# Patient Record
Sex: Female | Born: 1990 | Race: White | Hispanic: No | Marital: Married | Smoking: Never smoker
Health system: Southern US, Community
[De-identification: ages and names within clinical notes are randomized; demographics above are authoritative.]

## PROBLEM LIST (undated history)

## (undated) DIAGNOSIS — O021 Missed abortion: Secondary | ICD-10-CM

## (undated) DIAGNOSIS — Z789 Other specified health status: Secondary | ICD-10-CM

## (undated) HISTORY — PX: NO PAST SURGERIES: SHX2092

---

## 2018-06-21 LAB — OB RESULTS CONSOLE HEPATITIS B SURFACE ANTIGEN: Hepatitis B Surface Ag: NEGATIVE

## 2018-06-21 LAB — OB RESULTS CONSOLE ABO/RH: RH Type: POSITIVE

## 2018-06-21 LAB — OB RESULTS CONSOLE HIV ANTIBODY (ROUTINE TESTING): HIV: NONREACTIVE

## 2018-06-21 LAB — OB RESULTS CONSOLE GC/CHLAMYDIA
Chlamydia: NEGATIVE
Gonorrhea: NEGATIVE

## 2018-06-21 LAB — OB RESULTS CONSOLE RPR: RPR: NONREACTIVE

## 2018-06-21 LAB — OB RESULTS CONSOLE ANTIBODY SCREEN: Antibody Screen: NEGATIVE

## 2018-06-21 LAB — OB RESULTS CONSOLE RUBELLA ANTIBODY, IGM: Rubella: IMMUNE

## 2018-10-17 NOTE — L&D Delivery Note (Signed)
Operative Delivery Note At 6:34 AM a viable female was delivered via VAVD Severiano Gilbert).  Presentation: vertex; Position: Left,, Occiput,, Anterior; Station: +3.  Patient had been effectively pushing x 2.5 hours.  Secondary to maternal exhaustion, I offered either episiotomy of Kiwi vacuum assist to expedite delivery.  Patient elected to proceed with Kiwi vacuum.   Verbal consent: obtained from patient.  Risks and benefits discussed in detail.  Risks include, but are not limited to the risks of anesthesia, bleeding, infection, damage to maternal tissues, fetal cephalhematoma, intracranial hemorrhage.  There is also the risk of inability to effect vaginal delivery of the head, or shoulder dystocia that cannot be resolved by established maneuvers, leading to the need for emergency cesarean section.  APGAR: 8, 9; weight pending.   Placenta status: S, I.   Cord: 3V with the following complications: none.  Cord pH: n/a  Anesthesia:  CLEA Instruments: Kiwi vacuum with 2 contractions; three push/pull efforts each.  No pop offs. Episiotomy:  n/a Lacerations:  n/a Suture Repair: n/a Est. Blood Loss (mL):  100  Mom to postpartum.  Baby to Couplet care / Skin to Skin.  Linda Hedges 01/24/2019, 6:50 AM

## 2018-12-27 LAB — OB RESULTS CONSOLE GBS: STREP GROUP B AG: POSITIVE

## 2019-01-14 ENCOUNTER — Telehealth (HOSPITAL_COMMUNITY): Payer: Self-pay | Admitting: *Deleted

## 2019-01-14 ENCOUNTER — Encounter (HOSPITAL_COMMUNITY): Payer: Self-pay | Admitting: *Deleted

## 2019-01-14 NOTE — Telephone Encounter (Signed)
Preadmission screen  

## 2019-01-21 ENCOUNTER — Inpatient Hospital Stay (HOSPITAL_COMMUNITY): Payer: BLUE CROSS/BLUE SHIELD

## 2019-01-21 ENCOUNTER — Encounter (HOSPITAL_COMMUNITY): Payer: Self-pay | Admitting: *Deleted

## 2019-01-22 ENCOUNTER — Other Ambulatory Visit (HOSPITAL_COMMUNITY): Payer: Self-pay | Admitting: *Deleted

## 2019-01-23 ENCOUNTER — Inpatient Hospital Stay (HOSPITAL_COMMUNITY): Payer: BLUE CROSS/BLUE SHIELD

## 2019-01-23 ENCOUNTER — Inpatient Hospital Stay (HOSPITAL_COMMUNITY)
Admission: AD | Admit: 2019-01-23 | Discharge: 2019-01-25 | DRG: 807 | Disposition: A | Discharge status: Home / Self Care | Payer: BLUE CROSS/BLUE SHIELD | Attending: Obstetrics & Gynecology | Admitting: Obstetrics & Gynecology

## 2019-01-23 ENCOUNTER — Other Ambulatory Visit: Payer: Self-pay

## 2019-01-23 ENCOUNTER — Encounter (HOSPITAL_COMMUNITY): Payer: Self-pay | Admitting: Obstetrics

## 2019-01-23 ENCOUNTER — Inpatient Hospital Stay (HOSPITAL_COMMUNITY): Payer: BLUE CROSS/BLUE SHIELD | Admitting: Anesthesiology

## 2019-01-23 DIAGNOSIS — Z3A39 39 weeks gestation of pregnancy: Secondary | ICD-10-CM

## 2019-01-23 DIAGNOSIS — O99824 Streptococcus B carrier state complicating childbirth: Principal | ICD-10-CM | POA: Diagnosis present

## 2019-01-23 DIAGNOSIS — Z3490 Encounter for supervision of normal pregnancy, unspecified, unspecified trimester: Secondary | ICD-10-CM

## 2019-01-23 DIAGNOSIS — O26893 Other specified pregnancy related conditions, third trimester: Secondary | ICD-10-CM | POA: Diagnosis present

## 2019-01-23 LAB — CBC
HCT: 36.3 % (ref 36.0–46.0)
Hemoglobin: 12.5 g/dL (ref 12.0–15.0)
MCH: 31.3 pg (ref 26.0–34.0)
MCHC: 34.4 g/dL (ref 30.0–36.0)
MCV: 90.8 fL (ref 80.0–100.0)
Platelets: 153 10*3/uL (ref 150–400)
RBC: 4 MIL/uL (ref 3.87–5.11)
RDW: 13 % (ref 11.5–15.5)
WBC: 9.6 10*3/uL (ref 4.0–10.5)
nRBC: 0 % (ref 0.0–0.2)

## 2019-01-23 LAB — TYPE AND SCREEN
ABO/RH(D): O POS
Antibody Screen: NEGATIVE

## 2019-01-23 LAB — RPR: RPR Ser Ql: NONREACTIVE

## 2019-01-23 MED ORDER — LIDOCAINE HCL (PF) 1 % IJ SOLN
30.0000 mL | INTRAMUSCULAR | Status: DC | PRN
  Filled 2019-01-23 (×2): qty 30

## 2019-01-23 MED ORDER — LIDOCAINE HCL (PF) 1 % IJ SOLN
INTRAMUSCULAR | Status: DC | PRN
  Administered 2019-01-23: 6 mL via EPIDURAL

## 2019-01-23 MED ORDER — LACTATED RINGERS IV SOLN: 500.0000 mL | Freq: Once | INTRAVENOUS | Status: DC

## 2019-01-23 MED ORDER — FLEET ENEMA 7-19 GM/118ML RE ENEM: 1.0000 | ENEMA | RECTAL | Status: DC | PRN

## 2019-01-23 MED ORDER — EPHEDRINE 5 MG/ML INJ: 10.0000 mg | INTRAVENOUS | Status: DC | PRN

## 2019-01-23 MED ORDER — OXYTOCIN 40 UNITS IN NORMAL SALINE INFUSION - SIMPLE MED
1.0000 m[IU]/min | INTRAVENOUS | Status: DC
  Administered 2019-01-23: 2 m[IU]/min via INTRAVENOUS
  Filled 2019-01-23: qty 1000

## 2019-01-23 MED ORDER — LACTATED RINGERS IV SOLN
INTRAVENOUS | Status: DC
  Administered 2019-01-23 (×2): via INTRAVENOUS

## 2019-01-23 MED ORDER — LACTATED RINGERS IV SOLN: 500.0000 mL | INTRAVENOUS | Status: DC | PRN

## 2019-01-23 MED ORDER — ONDANSETRON HCL 4 MG/2ML IJ SOLN
4.0000 mg | Freq: Four times a day (QID) | INTRAMUSCULAR | Status: DC | PRN
  Administered 2019-01-24: 4 mg via INTRAVENOUS
  Filled 2019-01-23: qty 2

## 2019-01-23 MED ORDER — SODIUM CHLORIDE (PF) 0.9 % IJ SOLN
INTRAMUSCULAR | Status: DC | PRN
  Administered 2019-01-23: 14 mL/h via EPIDURAL

## 2019-01-23 MED ORDER — FENTANYL CITRATE (PF) 100 MCG/2ML IJ SOLN: 50.0000 ug | INTRAMUSCULAR | Status: DC | PRN

## 2019-01-23 MED ORDER — FENTANYL-BUPIVACAINE-NACL 0.5-0.125-0.9 MG/250ML-% EP SOLN
12.0000 mL/h | EPIDURAL | Status: DC | PRN
  Filled 2019-01-23: qty 250

## 2019-01-23 MED ORDER — TERBUTALINE SULFATE 1 MG/ML IJ SOLN: 0.2500 mg | Freq: Once | INTRAMUSCULAR | Status: DC | PRN

## 2019-01-23 MED ORDER — SODIUM CHLORIDE 0.9 % IV SOLN
5.0000 10*6.[IU] | Freq: Once | INTRAVENOUS | Status: AC
  Administered 2019-01-23: 5 10*6.[IU] via INTRAVENOUS
  Filled 2019-01-23: qty 5

## 2019-01-23 MED ORDER — DIPHENHYDRAMINE HCL 50 MG/ML IJ SOLN: 12.5000 mg | INTRAMUSCULAR | Status: DC | PRN

## 2019-01-23 MED ORDER — PHENYLEPHRINE 40 MCG/ML (10ML) SYRINGE FOR IV PUSH (FOR BLOOD PRESSURE SUPPORT): 80.0000 ug | PREFILLED_SYRINGE | INTRAVENOUS | Status: DC | PRN

## 2019-01-23 MED ORDER — OXYTOCIN 40 UNITS IN NORMAL SALINE INFUSION - SIMPLE MED
2.5000 [IU]/h | INTRAVENOUS | Status: DC
  Filled 2019-01-23: qty 1000

## 2019-01-23 MED ORDER — OXYTOCIN BOLUS FROM INFUSION: 500.0000 mL | Freq: Once | INTRAVENOUS | Status: DC

## 2019-01-23 MED ORDER — OXYCODONE-ACETAMINOPHEN 5-325 MG PO TABS: 1.0000 | ORAL_TABLET | ORAL | Status: DC | PRN

## 2019-01-23 MED ORDER — ACETAMINOPHEN 325 MG PO TABS: 650.0000 mg | ORAL_TABLET | ORAL | Status: DC | PRN

## 2019-01-23 MED ORDER — PENICILLIN G 3 MILLION UNITS IVPB - SIMPLE MED
3.0000 10*6.[IU] | INTRAVENOUS | Status: DC
  Administered 2019-01-23 – 2019-01-24 (×4): 3 10*6.[IU] via INTRAVENOUS
  Filled 2019-01-23 (×5): qty 100

## 2019-01-23 MED ORDER — SOD CITRATE-CITRIC ACID 500-334 MG/5ML PO SOLN: 30.0000 mL | ORAL | Status: DC | PRN

## 2019-01-23 MED ORDER — OXYCODONE-ACETAMINOPHEN 5-325 MG PO TABS: 2.0000 | ORAL_TABLET | ORAL | Status: DC | PRN

## 2019-01-23 NOTE — Anesthesia Preprocedure Evaluation (Signed)
Anesthesia Evaluation  Patient identified by MRN, date of birth, ID band Patient awake    Reviewed: Allergy & Precautions, H&P , NPO status , Patient's Chart, lab work & pertinent test results, reviewed documented beta blocker date and time   Airway Mallampati: II  TM Distance: >3 FB Neck ROM: full    Dental no notable dental hx.    Pulmonary neg pulmonary ROS,    Pulmonary exam normal breath sounds clear to auscultation       Cardiovascular negative cardio ROS Normal cardiovascular exam Rhythm:regular Rate:Normal     Neuro/Psych negative neurological ROS  negative psych ROS   GI/Hepatic negative GI ROS, Neg liver ROS,   Endo/Other  negative endocrine ROS  Renal/GU negative Renal ROS  negative genitourinary   Musculoskeletal   Abdominal   Peds  Hematology negative hematology ROS (+)   Anesthesia Other Findings   Reproductive/Obstetrics (+) Pregnancy                             Anesthesia Physical Anesthesia Plan  ASA: II  Anesthesia Plan: Epidural   Post-op Pain Management:    Induction:   PONV Risk Score and Plan:   Airway Management Planned:   Additional Equipment:   Intra-op Plan:   Post-operative Plan:   Informed Consent: I have reviewed the patients History and Physical, chart, labs and discussed the procedure including the risks, benefits and alternatives for the proposed anesthesia with the patient or authorized representative who has indicated his/her understanding and acceptance.     Dental Advisory Given  Plan Discussed with: Anesthesiologist  Anesthesia Plan Comments: (Labs checked- platelets confirmed with RN in room. Fetal heart tracing, per RN, reported to be stable enough for sitting procedure. Discussed epidural, and patient consents to the procedure:  included risk of possible headache,backache, failed block, allergic reaction, and nerve injury. This  patient was asked if she had any questions or concerns before the procedure started.)        Anesthesia Quick Evaluation  

## 2019-01-23 NOTE — Anesthesia Procedure Notes (Signed)
Epidural Patient location during procedure: OB Start time: 01/23/2019 5:05 PM End time: 01/23/2019 5:20 PM  Staffing Anesthesiologist: Janeece Riggers, MD  Preanesthetic Checklist Completed: patient identified, site marked, surgical consent, pre-op evaluation, timeout performed, IV checked, risks and benefits discussed and monitors and equipment checked  Epidural Patient position: sitting Prep: site prepped and draped and DuraPrep Patient monitoring: continuous pulse ox and blood pressure Approach: midline Location: L4-L5 Injection technique: LOR air  Needle:  Needle type: Tuohy  Needle gauge: 17 G Needle length: 9 cm and 9 Needle insertion depth: 5 cm Catheter type: closed end flexible Catheter size: 19 Gauge Catheter at skin depth: 10 cm Test dose: negative  Assessment Events: blood not aspirated, injection not painful, no injection resistance, negative IV test and no paresthesia

## 2019-01-23 NOTE — Progress Notes (Signed)
Patient has received first dose of PCN Pitocin on 8 mU and feeling mild CTX CVX 2/75/-1, AROM, clear FHT Cat I  Linda Hedges, DO

## 2019-01-23 NOTE — H&P (Signed)
Nanea Jared is a 28 y.o. female presenting for elective IOL.  Antepartum course uncomplicated.  GBS positive; NKDA.  OB History    Gravida  1   Para      Term      Preterm      AB      Living        SAB      TAB      Ectopic      Multiple      Live Births             No past medical history on file. Past Surgical History:  Procedure Laterality Date  . NO PAST SURGERIES     Family History: family history includes Anxiety disorder in her maternal grandmother; Colon cancer in her paternal grandfather; Diabetes in her paternal grandmother; Melanoma in her mother. Social History:  reports that she has never smoked. She has never used smokeless tobacco. She reports previous alcohol use. She reports that she does not use drugs.     Maternal Diabetes: No Genetic Screening: Normal Maternal Ultrasounds/Referrals: Normal Fetal Ultrasounds or other Referrals:  None Maternal Substance Abuse:  No Significant Maternal Medications:  None Significant Maternal Lab Results:  Lab values include: Group B Strep positive Other Comments:  None  ROS Maternal Medical History:  Fetal activity: Perceived fetal activity is normal.   Last perceived fetal movement was within the past hour.    Prenatal complications: no prenatal complications Prenatal Complications - Diabetes: none.      Blood pressure 123/82, pulse (!) 108, temperature 98.3 F (36.8 C), temperature source Oral, resp. rate 18, height 5\' 4"  (1.626 m), weight 64.4 kg. Maternal Exam:  Uterine Assessment: Contraction strength is mild.  Contraction frequency is rare.   Abdomen: Patient reports no abdominal tenderness. Fundal height is c/w dates.   Estimated fetal weight is 7#4.       Fetal Exam Fetal Monitor Review: Baseline rate: 155.  Variability: moderate (6-25 bpm).   Pattern: accelerations present and no decelerations.    Fetal State Assessment: Category I - tracings are normal.     Physical Exam   Constitutional: She is oriented to person, place, and time. She appears well-developed and well-nourished.  GI: Soft. There is no rebound and no guarding.  Neurological: She is alert and oriented to person, place, and time.  Skin: Skin is warm and dry.  Psychiatric: She has a normal mood and affect. Her behavior is normal.    Prenatal labs: ABO, Rh: O/Positive/-- (09/05 0000) Antibody: Negative (09/05 0000) Rubella: Immune (09/05 0000) RPR: Nonreactive (09/05 0000)  HBsAg: Negative (09/05 0000)  HIV: Non-reactive (09/05 0000)  GBS: Positive (03/12 0000)   Assessment/Plan: 28yo G1 at 39 weeks for elective IOL -PCN for GBS ppx -Start pitocin -AROM when able -CLEA when desired -Anticipate NSVD   Linda Hedges 01/23/2019, 8:13 AM

## 2019-01-24 ENCOUNTER — Encounter (HOSPITAL_COMMUNITY): Payer: Self-pay | Admitting: *Deleted

## 2019-01-24 LAB — ABO/RH: ABO/RH(D): O POS

## 2019-01-24 MED ORDER — COCONUT OIL OIL: 1.0000 "application " | TOPICAL_OIL | Status: DC | PRN

## 2019-01-24 MED ORDER — ZOLPIDEM TARTRATE 5 MG PO TABS: 5.0000 mg | ORAL_TABLET | Freq: Every evening | ORAL | Status: DC | PRN

## 2019-01-24 MED ORDER — ONDANSETRON HCL 4 MG/2ML IJ SOLN: 4.0000 mg | INTRAMUSCULAR | Status: DC | PRN

## 2019-01-24 MED ORDER — DIPHENHYDRAMINE HCL 25 MG PO CAPS: 25.0000 mg | ORAL_CAPSULE | Freq: Four times a day (QID) | ORAL | Status: DC | PRN

## 2019-01-24 MED ORDER — WITCH HAZEL-GLYCERIN EX PADS: 1.0000 "application " | MEDICATED_PAD | CUTANEOUS | Status: DC | PRN

## 2019-01-24 MED ORDER — OXYCODONE-ACETAMINOPHEN 5-325 MG PO TABS: 2.0000 | ORAL_TABLET | ORAL | Status: DC | PRN

## 2019-01-24 MED ORDER — KETOROLAC TROMETHAMINE 30 MG/ML IJ SOLN
INTRAMUSCULAR | Status: AC
  Filled 2019-01-24: qty 1

## 2019-01-24 MED ORDER — ACETAMINOPHEN 325 MG PO TABS: 650.0000 mg | ORAL_TABLET | ORAL | Status: DC | PRN

## 2019-01-24 MED ORDER — PRENATAL MULTIVITAMIN CH
1.0000 | ORAL_TABLET | Freq: Every day | ORAL | Status: DC
  Administered 2019-01-24 – 2019-01-25 (×2): 1 via ORAL
  Filled 2019-01-24 (×2): qty 1

## 2019-01-24 MED ORDER — ONDANSETRON HCL 4 MG PO TABS: 4.0000 mg | ORAL_TABLET | ORAL | Status: DC | PRN

## 2019-01-24 MED ORDER — SENNOSIDES-DOCUSATE SODIUM 8.6-50 MG PO TABS
2.0000 | ORAL_TABLET | ORAL | Status: DC
  Administered 2019-01-24: 2 via ORAL
  Filled 2019-01-24: qty 2

## 2019-01-24 MED ORDER — DIBUCAINE (PERIANAL) 1 % EX OINT: 1.0000 "application " | TOPICAL_OINTMENT | CUTANEOUS | Status: DC | PRN

## 2019-01-24 MED ORDER — OXYCODONE-ACETAMINOPHEN 5-325 MG PO TABS: 1.0000 | ORAL_TABLET | ORAL | Status: DC | PRN

## 2019-01-24 MED ORDER — IBUPROFEN 600 MG PO TABS
600.0000 mg | ORAL_TABLET | Freq: Four times a day (QID) | ORAL | Status: DC
  Administered 2019-01-24 – 2019-01-25 (×5): 600 mg via ORAL
  Filled 2019-01-24 (×5): qty 1

## 2019-01-24 MED ORDER — BENZOCAINE-MENTHOL 20-0.5 % EX AERO
1.0000 "application " | INHALATION_SPRAY | CUTANEOUS | Status: DC | PRN
  Administered 2019-01-24: 1 via TOPICAL
  Filled 2019-01-24: qty 56

## 2019-01-24 MED ORDER — TETANUS-DIPHTH-ACELL PERTUSSIS 5-2.5-18.5 LF-MCG/0.5 IM SUSP: 0.5000 mL | Freq: Once | INTRAMUSCULAR | Status: DC

## 2019-01-24 MED ORDER — SIMETHICONE 80 MG PO CHEW: 80.0000 mg | CHEWABLE_TABLET | ORAL | Status: DC | PRN

## 2019-01-24 NOTE — Anesthesia Postprocedure Evaluation (Signed)
Anesthesia Post Note  Patient: Kathleen Sanchez  Procedure(s) Performed: AN AD [place]     Patient location during evaluation: Mother Baby Anesthesia Type: Epidural Level of consciousness: awake, awake and alert, oriented and patient cooperative Pain management: pain level controlled Vital Signs Assessment: post-procedure vital signs reviewed and stable Respiratory status: spontaneous breathing, nonlabored ventilation and respiratory function stable Cardiovascular status: stable Postop Assessment: no headache, no backache, no apparent nausea or vomiting and patient able to bend at knees Anesthetic complications: no Comments: Phone interview with patient.    Last Vitals:  Vitals:   01/24/19 1340 01/24/19 1740  BP: 112/69 116/79  Pulse: 65 70  Resp: 18 18  Temp: 36.8 C 36.7 C  SpO2:      Last Pain:  Vitals:   01/24/19 1740  TempSrc: Axillary  PainSc: 0-No pain   Pain Goal:                   Renly Guedes L

## 2019-01-24 NOTE — Lactation Note (Signed)
This note was copied from a baby's chart. Lactation Consultation Note  Patient Name: Kathleen Sanchez Date: 01/24/2019  Randel Books is 24 hours old and has been sleepy.  Baby is currently sleeping in crib and mom trying to nap.  Instructed to watch for feeding cues and call for feeding assist.   Maternal Data    Feeding    LATCH Score                   Interventions    Lactation Tools Discussed/Used     Consult Status      Ave Filter 01/24/2019, 11:50 AM

## 2019-01-24 NOTE — Lactation Note (Signed)
This note was copied from a baby's chart. Lactation Consultation Note  Patient Name: Kathleen Sanchez [id number] Date: 01/24/2019 Reason for consult: Initial assessment;Primapara;1st time breastfeeding;Term  -Mom called out for assistance with breastfeeding. -Initial visit with P55 mom, baby is now 26 hours old, del @ 39.4wks -Upon [facility] entrance, FOB changing diaper and mom preparing to feed. -Mom denies any leaking during her pregnancy but reports changes in breast size and shape. -Mom with medium size breasts and medium size erect nipples. -Demonstrated hand expression and breast massage prior to latching. I-nfant latched to right breast in cross-cradle hold after a few attempts, with mom using bobby pillow for support. Mom with tendency to pinch nipple forward and keep finger close to base of nipple on underside of breast. Demonstrated supporting breast further back and reviewed basic latch techniques. Infant's mouth open wide and lips flanged well. Audible swallows noted. Infant's body relaxed as feeding progressed. -Mom's nipple rounded after feeding x20 minutes and infant released on her own. Instructed mom to burp after feedings and offer second breast if desired.  -Demonstrated breaking the baby's seal on the breast if necessary to try for better latch, etc. -Discussed having nose and chin touching breast during feeding and striving for deep latch.  -Reviewed feeding 8-12 times in 24 hours and feeding with cues. Discussed feeding cues. -Reviewed expected output and milk production around day 3. -Discussed cross-cradle and football holds, alternating breasts, using pillows and/or rolled blankets for support. -Encouraged as much STS as possible, including STS with dad. -Reviewed methods to wake a sleepy baby for feedings and/or keep her awake during the feed. -Discussed using EBM and/or coconut oil if necessary for sore nipples. -Lactation brochure with phone number provided and notified of  inpatient/outpatient lactation services. Encouraged to call with any needs prior to [facility] visit tomorrow.  Maternal Data Has patient been taught Hand Expression?: Yes(reviewed) Does the patient have breastfeeding experience prior to this delivery?: No  Feeding Feeding Type: Breast Fed  LATCH Score Latch: Grasps breast easily, tongue down, lips flanged, rhythmical sucking.  Audible Swallowing: Spontaneous and intermittent  Type of Nipple: Everted at rest and after stimulation  Comfort (Breast/Nipple): Soft / non-tender  Hold (Positioning): Assistance needed to correctly position infant at breast and maintain latch.  LATCH Score: 9  Interventions Interventions: Breast feeding basics reviewed;Assisted with latch;Skin to skin;Breast massage;Hand express;Adjust position;Support pillows;Position options;Expressed milk;Coconut oil   Consult Status Consult Status: Follow-up Date: 01/25/19 Follow-up type: In-patient    Cranston Neighbor 01/24/2019, 4:43 PM

## 2019-01-25 LAB — CBC
HCT: 32.1 % — ABNORMAL LOW (ref 36.0–46.0)
Hemoglobin: 10.5 g/dL — ABNORMAL LOW (ref 12.0–15.0)
MCH: 29.8 pg (ref 26.0–34.0)
MCHC: 32.7 g/dL (ref 30.0–36.0)
MCV: 91.2 fL (ref 80.0–100.0)
Platelets: 116 10*3/uL — ABNORMAL LOW (ref 150–400)
RBC: 3.52 MIL/uL — ABNORMAL LOW (ref 3.87–5.11)
RDW: 13.1 % (ref 11.5–15.5)
WBC: 10.9 10*3/uL — ABNORMAL HIGH (ref 4.0–10.5)
nRBC: 0 % (ref 0.0–0.2)

## 2019-01-25 NOTE — Progress Notes (Signed)
Patient doing well. No complaints. BP 111/79 (BP Location: Right Arm)   Pulse 76   Temp 98.1 F (36.7 C) (Oral)   Resp 18   Ht 5\' 4"  (1.626 m)   Wt 64.4 kg   SpO2 99%   Breastfeeding Unknown   BMI 24.37 kg/m  Results for orders placed or performed during the hospital encounter of 01/23/19 (from the past 24 hour(s))  CBC     Status: Abnormal   Collection Time: 01/25/19  7:01 AM  Result Value Ref Range   WBC 10.9 (H) 4.0 - 10.5 K/uL   RBC 3.52 (L) 3.87 - 5.11 MIL/uL   Hemoglobin 10.5 (L) 12.0 - 15.0 g/dL   HCT 32.1 (L) 36.0 - 46.0 %   MCV 91.2 80.0 - 100.0 fL   MCH 29.8 26.0 - 34.0 pg   MCHC 32.7 30.0 - 36.0 g/dL   RDW 13.1 11.5 - 15.5 %   Platelets 116 (L) 150 - 400 K/uL   nRBC 0.0 0.0 - 0.2 %   Abdomen soft and non tender Lochia WHL  PPD # 1  Doing well Routine care

## 2019-01-25 NOTE — Discharge Summary (Signed)
Obstetric Discharge Summary Reason for Admission: induction of labor Prenatal Procedures: none Intrapartum Procedures: Vacuum Postpartum Procedures: none Complications-Operative and Postpartum: none Hemoglobin  Date Value Ref Range Status  01/25/2019 10.5 (L) 12.0 - 15.0 g/dL Final   HCT  Date Value Ref Range Status  01/25/2019 32.1 (L) 36.0 - 46.0 % Final    Physical Exam:  General: alert, cooperative and appears stated age 28: appropriate Uterine Fundus: firm Incision: healing well, no significant drainage, no dehiscence DVT Evaluation: No evidence of DVT seen on physical exam.  Discharge Diagnoses: Term Pregnancy-delivered  Discharge Information: Date: 01/25/2019 Activity: pelvic rest Diet: routine Medications: None Condition: improved Instructions: refer to practice specific booklet Discharge to: home   Newborn Data: Live born female  Birth Weight: 6 lb 8.1 oz (2950 g) APGAR: 7, 9  Newborn Delivery   Birth date/time:  01/24/2019 06:34:00 Delivery type:  Vaginal, Spontaneous     Home with mother.  Cyril Mourning 01/25/2019, 1:45 PM

## 2019-01-25 NOTE — Lactation Note (Signed)
This note was copied from a baby's chart. Lactation Consultation Note  Patient Name: Kathleen Sanchez [id number] Date: 01/25/2019 Reason for consult: Follow-up assessment;Primapara;1st time breastfeeding;Term  P1 mother whose infant is now 12 hours old.    Mother had recently finished breast feeding baby but she is awake and showing cues.  Offered to assist with latching and mother accepted.  Mother's breasts are soft and non tender and nipples are everted and intact.  Mother does have a bruised area on her left nipple from a prior poor latch.  She stated that the nipple hurts and she has been only feeding from the right side with the last couple attempts.  Suggested she hand express after feedings and rub EBM into nipple/areolas and follow with coconut oil.  Mother wanted to try latching initially without assistance.  Observed her trying to latch baby before she opened real wide and "forcing" nipple into mouth.  Baby latched but was not deep.  Mother noted pain.  I had her break the suction and assisted a second time with a deep latch after baby opened wide.  Baby had flanged lips and began sucking.  Demonstrated breast compressions and how to keep baby awake at the breast for effective sucking.  Also demonstrated how to do a gentle chin tug if mother still had pain at the breast after latching.  Stressed the importance of obtaining a good deep latch even if she has to attempt this a few times.    Encouraged to continue feeding 8-12 times/24 hours or sooner if baby shows feeding cues.  Reviewed cues.  Observed mother's hand expression technique and assisted for better hand placement.  Colostrum container provided and milk storage times reviewed.  Finger feeding demonstrated.    Baby fed for approximately 15 minutes and became sleepy.  Father demonstrated a good swaddle and placed baby in bassinet.  RN in to room for medication and updated.  Mother will call as needed for latch  assistance.  Mother will return to work in 59 weeks.  She has a DEBP for home use.  Father present and supportive.  Both parents are very receptive to learning.   Maternal Data Formula Feeding for Exclusion: No Has patient been taught Hand Expression?: Yes Does the patient have breastfeeding experience prior to this delivery?: No  Feeding Feeding Type: Breast Fed  LATCH Score Latch: Grasps breast easily, tongue down, lips flanged, rhythmical sucking.  Audible Swallowing: A few with stimulation  Type of Nipple: Everted at rest and after stimulation  Comfort (Breast/Nipple): Filling, red/small blisters or bruises, mild/mod discomfort  Hold (Positioning): Assistance needed to correctly position infant at breast and maintain latch.  LATCH Score: 7  Interventions Interventions: Breast feeding basics reviewed;Assisted with latch;Breast massage;Skin to skin;Breast compression;Expressed milk;Position options;Support pillows;Adjust position  Lactation Tools Discussed/Used WIC Program: No   Consult Status Consult Status: Follow-up Date: 01/26/19 Follow-up type: In-patient    Lovie Agresta R Harryette Shuart 01/25/2019, 11:59 AM

## 2019-03-05 DIAGNOSIS — R87619 Unspecified abnormal cytological findings in specimens from cervix uteri: Secondary | ICD-10-CM | POA: Insufficient documentation

## 2019-05-22 LAB — HM PAP SMEAR: HM Pap smear: NEGATIVE

## 2019-11-26 ENCOUNTER — Ambulatory Visit (INDEPENDENT_AMBULATORY_CARE_PROVIDER_SITE_OTHER): Payer: 59 | Admitting: Otolaryngology

## 2019-11-26 ENCOUNTER — Other Ambulatory Visit: Payer: Self-pay

## 2019-11-26 ENCOUNTER — Encounter (INDEPENDENT_AMBULATORY_CARE_PROVIDER_SITE_OTHER): Payer: Self-pay | Admitting: Otolaryngology

## 2019-11-26 VITALS — Temp 97.7°F

## 2019-11-26 DIAGNOSIS — K219 Gastro-esophageal reflux disease without esophagitis: Secondary | ICD-10-CM | POA: Diagnosis not present

## 2019-11-26 DIAGNOSIS — R1314 Dysphagia, pharyngoesophageal phase: Secondary | ICD-10-CM | POA: Diagnosis not present

## 2019-11-26 NOTE — Progress Notes (Signed)
HPI: Kathleen Sanchez is a 29 y.o. female who presents is referred by her PCP for evaluation of throat complaints.  About a week ago she was eating breakfast which consisted of cereal that felt like it got caught in her throat.  She swallowed some liquids and it eventually passed but she continues to have the symptoms of discomfort and something caught in her throat and points to the area of the cricoid cartilage and below.  She still has had slight sensation.  She has had no trouble eating or swallowing but has a chronic globus type symptoms.  She is having no hoarseness.  No pain when she swallows. She has had Covid test that have been negative although her husband was positive..  No past medical history on file. Past Surgical History:  Procedure Laterality Date  . NO PAST SURGERIES     Social History   Socioeconomic History  . Marital status: Married    Spouse name: Not on file  . Number of children: Not on file  . Years of education: Not on file  . Highest education level: Not on file  Occupational History  . Not on file  Tobacco Use  . Smoking status: Never Smoker  . Smokeless tobacco: Never Used  Substance and Sexual Activity  . Alcohol use: Not Currently  . Drug use: Never  . Sexual activity: Not on file  Other Topics Concern  . Not on file  Social History Narrative  . Not on file   Social Determinants of Health   Financial Resource Strain: Low Risk   . Difficulty of Paying Living Expenses: Not hard at all  Food Insecurity: No Food Insecurity  . Worried About Charity fundraiser in the Last Year: Never true  . Ran Out of Food in the Last Year: Never true  Transportation Needs: Unknown  . Lack of Transportation (Medical): No  . Lack of Transportation (Non-Medical): Not on file  Physical Activity:   . Days of Exercise per Week: Not on file  . Minutes of Exercise per Session: Not on file  Stress: No Stress Concern Present  . Feeling of Stress : Only a little  Social  Connections:   . Frequency of Communication with Friends and Family: Not on file  . Frequency of Social Gatherings with Friends and Family: Not on file  . Attends Religious Services: Not on file  . Active Member of Clubs or Organizations: Not on file  . Attends Archivist Meetings: Not on file  . Marital Status: Not on file   Family History  Problem Relation Age of Onset  . Melanoma Mother   . Anxiety disorder Maternal Grandmother   . Diabetes Paternal Grandmother   . Colon cancer Paternal Grandfather    No Known Allergies Prior to Admission medications   Medication Sig Start Date End Date Taking? Authorizing Provider  Prenatal Vit-Fe Fumarate-FA (PRENATAL MULTIVITAMIN) TABS tablet Take 1 tablet by mouth daily at 12 noon.    [provider]     Positive ROS: Otherwise negative  All other systems have been reviewed and were otherwise negative with the exception of those mentioned in the HPI and as above.  Physical Exam: Constitutional: Alert, well-appearing, no acute distress Ears: External ears without lesions or tenderness. Ear canals are clear bilaterally with intact, clear TMs.  Nasal: External nose without lesions. Septum midline. Clear nasal passages Oral: Lips and gums without lesions. Tongue and palate mucosa without lesions. Posterior oropharynx clear. Fiberoptic laryngoscopy  was performed through the left nostril.  The nasopharynx was clear.  Base of tongue vallecula epiglottis were normal.  Vocal cords were normal bilaterally.  Had minimal arytenoid edema no erythema.  The fiberoptic laryngoscope was passed through the upper esophageal sphincter without difficulty.  The upper cervical esophagus was clear with no evidence of any residual foreign body.  No lesions noted. Neck: No palpable adenopathy or masses trachea midline with no paratracheal masses or thyroid nodules noted. Respiratory: Breathing comfortably  Skin: No facial/neck lesions or rash  noted.  Laryngoscopy  Date/Time: 11/26/2019 2:44 PM Performed by: Rozetta Nunnery, MD Authorized by: Rozetta Nunnery, MD   Consent:    Consent obtained:  Verbal   Consent given by:  Patient   Risks discussed:  Pain Procedure details:    Indications: direct visualization of the upper aerodigestive tract     Medication:  Afrin   Instrument: flexible fiberoptic laryngoscope     Scope location: left nare   Mouth:    Vallecula: normal     Epiglottis: normal   Throat:    Pyriform sinus: normal     True vocal cords: normal   Comments:     Normal upper airway examination.  The fiberoptic laryngoscope was passed through the upper esophageal sphincter and the upper cervical esophagus was clear.    Assessment: Globus type symptoms possibly related to previous episode of dysphagia versus laryngeal pharyngeal reflux disease.  Plan: Discussed with patient concerning normal examination. I would expect symptoms to gradually resolve over the next week.  If symptoms do not resolve I prescribed omeprazole 40 mg daily before dinner for 4 weeks. If she continues to have persistent symptoms beyond 4 weeks of omeprazole she will call us back.   Radene Journey, MD   CC:

## 2020-03-20 ENCOUNTER — Other Ambulatory Visit: Payer: Self-pay | Admitting: Family Medicine

## 2020-03-20 DIAGNOSIS — M7989 Other specified soft tissue disorders: Secondary | ICD-10-CM

## 2020-03-26 ENCOUNTER — Ambulatory Visit
Admission: RE | Admit: 2020-03-26 | Discharge: 2020-03-26 | Disposition: A | Discharge status: Home / Self Care | Payer: 59 | Source: Ambulatory Visit | Attending: Family Medicine | Admitting: Family Medicine

## 2020-03-26 DIAGNOSIS — M7989 Other specified soft tissue disorders: Secondary | ICD-10-CM

## 2020-04-27 ENCOUNTER — Other Ambulatory Visit: Payer: Self-pay

## 2020-04-27 ENCOUNTER — Encounter: Payer: Self-pay | Admitting: Physician Assistant

## 2020-04-27 ENCOUNTER — Ambulatory Visit (INDEPENDENT_AMBULATORY_CARE_PROVIDER_SITE_OTHER): Payer: No Typology Code available for payment source | Admitting: Physician Assistant

## 2020-04-27 VITALS — BP 110/78 | HR 98 | Temp 98.1°F | Ht 64.0 in | Wt 119.6 lb

## 2020-04-27 DIAGNOSIS — R200 Anesthesia of skin: Secondary | ICD-10-CM | POA: Diagnosis not present

## 2020-04-27 DIAGNOSIS — R202 Paresthesia of skin: Secondary | ICD-10-CM

## 2020-04-27 DIAGNOSIS — R2241 Localized swelling, mass and lump, right lower limb: Secondary | ICD-10-CM | POA: Diagnosis not present

## 2020-04-27 NOTE — Patient Instructions (Signed)
It was great to see you!  A referral has been placed for you to see Dr. Evan Corey with [facility]. Someone from there office will be in touch soon regarding your appointment with him. His location: [facility] at [place] [address] on the 1st floor.   Phone number [phone], Fax [phone].  This location is across the street from the entrance to [facility] and in the same complex as the [facility] and [facility]   Take care,  Sharrod Achille PA-C  

## 2020-04-27 NOTE — Progress Notes (Signed)
Kathleen Sanchez is a 29 y.o. female is here to establish care.  I acted as a Education administrator for [employer], PA-C [employer], [place]  History of Present Illness:   Chief Complaint  Patient presents with  . New Patient (Initial Visit)    HPI  Lump in leg Pt c/o raised area of skin to R outer thigh. She noticed a lump after doing a leg workout about a month ago. She went to an urgent care regarding this issue and she was told to get an ultrasound, which was negative. The area is not painful, has not changed in size since she first noticed it.  Tingling to R outer toes She has also noticed tingling in outer 3 toes of the right foot whenever the anterior portion of her R ankle has pressure applied to it. Denies known trauma. Has not changed with time.   Health Maintenance Due  Topic Date Due  . Hepatitis C Screening  Never done  . TETANUS/TDAP  Never done    History reviewed. No pertinent past medical history.   Social History   Tobacco Use  . Smoking status: Never Smoker  . Smokeless tobacco: Never Used  Vaping Use  . Vaping Use: Never used  Substance Use Topics  . Alcohol use: Not Currently  . Drug use: Never    Past Surgical History:  Procedure Laterality Date  . NO PAST SURGERIES      Family History  Problem Relation Age of Onset  . Melanoma Mother   . Anxiety disorder Maternal Grandmother   . Diabetes Paternal Grandmother   . Cancer Paternal Grandfather        unknown  . Breast cancer Neg Hx     PMHx, SurgHx, SocialHx, FamHx, Medications, and Allergies were reviewed in the Visit Navigator and updated as appropriate.   Patient Active Problem List   Diagnosis Date Noted  . Abnormal cervical Papanicolaou smear 03/05/2019  . Pregnancy 01/23/2019    Social History   Tobacco Use  . Smoking status: Never Smoker  . Smokeless tobacco: Never Used  Vaping Use  . Vaping Use: Never used  Substance Use Topics  . Alcohol use: Not Currently  . Drug use: Never      Current Medications and Allergies:    Current Outpatient Medications:  .  Prenatal Vit-Fe Fumarate-FA (PRENATAL MULTIVITAMIN) TABS tablet, Take 1 tablet by mouth daily at 12 noon., Disp: , Rfl:   No Known Allergies  Review of Systems   ROS Negative unless otherwise specified per HPI.  Vitals:   Vitals:   04/27/20 0901  BP: 110/78  Pulse: 98  Temp: 98.1 F (36.7 C)  TempSrc: Temporal  SpO2: 99%  Weight: 119 lb 9.6 oz (54.3 kg)  Height: 5\' 4"  (1.626 m)     Body mass index is 20.53 kg/m.   Physical Exam:    Physical Exam Constitutional:      Appearance: She is well-developed.  HENT:     Head: Normocephalic and atraumatic.  Eyes:     Conjunctiva/sclera: Conjunctivae normal.  Pulmonary:     Effort: Pulmonary effort is normal.  Musculoskeletal:        General: Normal range of motion.     Cervical back: Normal range of motion and neck supple.     Comments: R thigh: Visible, raised mass to lateral R thigh without TTP  R foot: No visible abnormalities.   Skin:    General: Skin is warm and dry.  Neurological:  Mental Status: She is alert and oriented to person, place, and time.     Comments: When pressing anterior R ankle or with extension of R foot, patient reports tingling to R outer toes.  Psychiatric:        Behavior: Behavior normal.        Thought Content: Thought content normal.        Judgment: Judgment normal.      Assessment and Plan:    [name] was seen today for new patient (initial visit).  Diagnoses and all orders for this visit:  Lump of right thigh Suspect lipoma, but will refer to Sports Medicine for further evaluation and management.  Numbness and tingling of right lower extremity Possible intermittent neuritis of R ankle/lower extremity. Will refer to Sports Medicine for further evaluation and management.  . Reviewed expectations re: course of current medical issues. . Discussed self-management of symptoms. . Outlined  signs and symptoms indicating need for more acute intervention. . Patient verbalized understanding and all questions were answered. . See orders for this visit as documented in the electronic medical record. . Patient received an After Visit Summary.  CMA or LPN served as scribe during this visit. History, Physical, and Plan performed by medical provider. The above documentation has been reviewed and is accurate and complete.  Inda Coke, PA-C [place] [place] 04/27/2020  Follow-up: No follow-ups on file.

## 2020-04-27 NOTE — Assessment & Plan Note (Signed)
Hx of colposcopy

## 2020-05-05 ENCOUNTER — Encounter: Payer: Self-pay | Admitting: Physician Assistant

## 2020-06-09 ENCOUNTER — Encounter: Payer: Self-pay | Admitting: Family Medicine

## 2020-06-09 ENCOUNTER — Ambulatory Visit: Payer: No Typology Code available for payment source | Admitting: Family Medicine

## 2020-06-09 ENCOUNTER — Ambulatory Visit: Payer: Self-pay

## 2020-06-09 ENCOUNTER — Other Ambulatory Visit: Payer: Self-pay

## 2020-06-09 VITALS — BP 102/72 | HR 100 | Ht 64.0 in | Wt 119.6 lb

## 2020-06-09 DIAGNOSIS — D1723 Benign lipomatous neoplasm of skin and subcutaneous tissue of right leg: Secondary | ICD-10-CM | POA: Diagnosis not present

## 2020-06-09 DIAGNOSIS — R202 Paresthesia of skin: Secondary | ICD-10-CM

## 2020-06-09 DIAGNOSIS — M79651 Pain in right thigh: Secondary | ICD-10-CM | POA: Diagnosis not present

## 2020-06-09 NOTE — Patient Instructions (Addendum)
Thank you for coming in today.  I think the thigh bump is a lipoma.  OK to watch. If worsening or bothersome let me know. Will arrange MRI for further eval and possible excision planning.  OK to ignore if not bothersome.   The ankle and foot numb tingly is likely irritation of a small nerve in the foot.  Again watchful waiting. Consider taking B complex vitamins for a month. In general recommend also taking Vit D in supplement 1000-5000 units daily.   If worsening we can do more including prescribing a medicine for mostly bedtime and ordering more tests.   The name of the numb tingly is paresthesia.   Keep me updated and recheck as needed.

## 2020-06-09 NOTE — Progress Notes (Signed)
Subjective:    I'm seeing this patient as a consultation for:  Len Blalock, [place]. Note will be routed back to referring provider/PCP.  CC: R thigh pain and numbness/tingling in R toes  I, Molly Weber, LAT, ATC, am serving as scribe for Dr. Lynne Leader.  HPI: Pt is a 29 y/o female presenting w/ c/o R thigh pain and numbness/tingling in R toes 3-5.  R thigh: She reports noticing a "bump/lump" on her R thigh after doing a leg workout in May 2021.  She locates the "bump" to her R distal, lateral quad.  There is no pain associated w/ this.  She feels like it is unchanged since it appeared.  R toes 3-5: Pt notes numbness/tingling in her R toes 3-5.  Aggravating factors include pressure to her R anterior ankle.  Past medical history, Surgical history, Family history, Social history, Allergies, and medications have been entered into the medical record, reviewed.   Review of Systems: No new headache, visual changes, nausea, vomiting, diarrhea, constipation, dizziness, abdominal pain, skin rash, fevers, chills, night sweats, weight loss, swollen lymph nodes, body aches, joint swelling, muscle aches, chest pain, shortness of breath, mood changes, visual or auditory hallucinations.   Objective:    Vitals:   06/09/20 0831  BP: 102/72  Pulse: 100  SpO2: 98%   General: Well Developed, well nourished, and in no acute distress.  Neuro/Psych: Alert and oriented x3, extra-ocular muscles intact, able to move all 4 extremities, sensation grossly intact. Skin: Warm and dry, no rashes noted.  Respiratory: Not using accessory muscles, speaking in full sentences, trachea midline.  Cardiovascular: Pulses palpable, no extremity edema. Abdomen: Does not appear distended. MSK: Right thigh slight swelling at the lateral distal thigh.  Nontender normal hip and knee motion and strength.  Right ankle normal-appearing normal motion and strength. Pressure over anterior lateral ankle produces paresthesias dorsal  lateral 3 toes and dorsal lateral foot Pulses capillary fill and sensation are intact distally.  Lab and Radiology Results \ Diagnostic Limited MSK Ultrasound of: Right thigh and right ankle Right thigh: Normal-appearing muscular structures. Area of palpable small nodularity or swelling increased thickness subcutaneous fat with area of circular fat nodule consistent in appearance with lipoma. Right ankle: Normal-appearing anterior ankle structures.  Pressure overlying anterior nerve structures likely intermediate dorsal cutaneous nerve reproduces paresthesias and symptoms. Impression: Lipoma right thigh. Normal-appearing anterior ankle   Impression and Recommendations:    Assessment and Plan: 29 y.o. female with right thigh very likely lipoma based on ultrasound examination.  However of note ultrasound is not sufficient by itself to fully diagnose lipoma.  Plan for watchful waiting and if worsening or changing. Next step if needed would be MRI.   Ankle and foot paresthesia. Less certain about this diagnosis. Patient has reproducible short-term mild paresthesias in her dorsal lateral foot and toes with pressure overlying anterior ankle. This is likely due to injury or irritation of likely the intermediate dorsal cutaneous nerve. Discussed options with patient. Plan for watchful waiting again. Recommend supplementing with B complex multivitamin of vitamin D. If not improving or if worsening or developing weakness will proceed with nerve conduction study and other testing. Also could prescribe gabapentin as needed in the future however not necessary at this time. Watchful waiting.   Orders Placed This Encounter  Procedures  . [place] LIMITED JOINT SPACE STRUCTURES LOW RIGHT(NO LINKED CHARGES)    Order Specific Question:   Reason for Exam (SYMPTOM  OR DIAGNOSIS REQUIRED)    Answer:  R thigh pain    Order Specific Question:   Preferred imaging location?    Answer:   [place]   No orders of the defined types were placed in this encounter.   Discussed warning signs or symptoms. Please see discharge instructions. Patient expresses understanding.   The above documentation has been reviewed and is accurate and complete Lynne Leader, M.D.

## 2020-07-03 ENCOUNTER — Encounter: Payer: Self-pay | Admitting: Family Medicine

## 2020-07-03 ENCOUNTER — Other Ambulatory Visit: Payer: Self-pay

## 2020-07-03 ENCOUNTER — Ambulatory Visit: Payer: No Typology Code available for payment source | Admitting: Family Medicine

## 2020-07-03 VITALS — BP 113/81 | HR 117 | Temp 98.5°F | Ht 64.0 in | Wt 118.4 lb

## 2020-07-03 DIAGNOSIS — J0100 Acute maxillary sinusitis, unspecified: Secondary | ICD-10-CM

## 2020-07-03 DIAGNOSIS — J040 Acute laryngitis: Secondary | ICD-10-CM | POA: Diagnosis not present

## 2020-07-03 MED ORDER — IPRATROPIUM BROMIDE 0.06 % NA SOLN: 2.0000 | Freq: Three times a day (TID) | NASAL | 1 refills | Status: DC

## 2020-07-03 MED ORDER — AMOXICILLIN-POT CLAVULANATE 875-125 MG PO TABS: 1.0000 | ORAL_TABLET | Freq: Two times a day (BID) | ORAL | 0 refills | Status: DC

## 2020-07-03 NOTE — Progress Notes (Signed)
Patient: Kathleen Sanchez MRN: [id number] DOB: 1991-03-12 PCP: Inda Coke, PA     Subjective:  Chief Complaint  Patient presents with  . Nasal Congestion  . Hoarse    HPI: The patient is a 29 y.o. female who presents today for nasal congestion/sinus pain/pressure that started on Saturday. She started with a runny nose and sinus congestion. [place]. She took a zyrtec D and afrin x 3 days. She doesn't feel like it's helping this time. She woke up with hoarseness yesterday. No fever/chills. She is blowing out a lot of green, thick mucous. Her teeth hurt as well. She has no foul smell to mucous. No loss of sense of smell. She had a headache yesterday and has pressure in her head and sinuses. She has a cough that is very mild after talking a lot. No shortness of breath, no wheezing. She is covid vaccinated. Had rapid covid test that was negative on Tuesday. Waiting for pcr. Exposed to covid on Saturday, but was outside and less than 15 minutes.   Review of Systems  Constitutional: Negative for chills and fatigue.  HENT: Positive for congestion, sinus pressure and sinus pain. Negative for sore throat.   Respiratory: Negative for shortness of breath.   Cardiovascular: Negative for chest pain.  Neurological: Positive for headaches. Negative for dizziness.    Allergies Patient has No Known Allergies.  Past Medical History Patient  has no past medical history on file.  Surgical History Patient  has a past surgical history that includes No past surgeries.  Family History Pateint's family history includes Anxiety disorder in her maternal grandmother; Cancer in her paternal grandfather; Diabetes in her paternal grandmother; Heart attack in her paternal grandmother; Hyperlipidemia in her father; Melanoma in her mother; Osteoarthritis in her maternal grandfather.  Social History Patient  reports that she has never smoked. She has  never used smokeless tobacco. She reports previous alcohol use. She reports that she does not use drugs.    Objective: Vitals:   07/03/20 1019  BP: 113/81  Pulse: (!) 117  Temp: 98.5 F (36.9 C)  TempSrc: Temporal  SpO2: 100%  Weight: 118 lb 6.4 oz (53.7 kg)  Height: 5\' 4"  (1.626 m)    Body mass index is 20.32 kg/m.  Physical Exam Vitals reviewed.  Constitutional:      Appearance: Normal appearance. She is normal weight.  HENT:     Head: Normocephalic and atraumatic.     Comments: TTP over bilateral maxillary sinuses     Right Ear: Tympanic membrane, ear canal and external ear normal.     Left Ear: Tympanic membrane, ear canal and external ear normal.     Nose: Congestion present.  Eyes:     Extraocular Movements: Extraocular movements intact.     Pupils: Pupils are equal, round, and reactive to light.  Cardiovascular:     Rate and Rhythm: Normal rate and regular rhythm.     Heart sounds: Normal heart sounds.  Pulmonary:     Effort: Pulmonary effort is normal.     Breath sounds: Normal breath sounds.  Abdominal:     General: Abdomen is flat. Bowel sounds are normal.     Palpations: Abdomen is soft.  Musculoskeletal:     Cervical back: Normal range of motion and neck supple.  Lymphadenopathy:     Cervical: Cervical adenopathy present.  Skin:    Capillary Refill: Capillary refill takes less than 2 seconds.  Neurological:     General: No focal deficit present.     Mental Status: She is alert and oriented to person, place, and time.  Psychiatric:        Mood and Affect: Mood normal.        Behavior: Behavior normal.        Assessment/plan: 1. Acute maxillary sinusitis, recurrence not specified -do not think she has covid. pcr test pending. Rapid negative. They are to call me if positive covid. 7+ days with bacterial signs/sympotms.  -10 day course of augmentin. Safe in pregnancy. Recommended take with food/probiotic.  -atrovent nasal spray. Class B in  pregnancy -cool mist humidifier -if finds out she is not pregnant and still having congestion told her I would send in steroid burst if wants/needs.   2. Laryngitis Likely secondary to drainage. Do not want to do steroids in case she is pregnant. Vocal rest, hydration and cool mist humidifier. discussed will get better with time and treating offending drainage.   -continue pnv.   This visit occurred during the SARS-CoV-2 public health emergency.  Safety protocols were in place, including screening questions prior to the visit, additional usage of staff PPE, and extensive cleaning of exam room while observing appropriate contact time as indicated for disinfecting solutions.     Return if symptoms worsen or fail to improve.    Orma Flaming, MD [place]   07/03/2020

## 2020-07-03 NOTE — Patient Instructions (Signed)
Sinusitis, Adult Sinusitis is soreness and swelling (inflammation) of your sinuses. Sinuses are hollow spaces in the bones around your face. They are located:  Around your eyes.  In the middle of your forehead.  Behind your nose.  In your cheekbones. Your sinuses and nasal passages are lined with a fluid called mucus. Mucus drains out of your sinuses. Swelling can trap mucus in your sinuses. This lets germs (bacteria, virus, or fungus) grow, which leads to infection. Most of the time, this condition is caused by a virus. What are the causes? This condition is caused by:  Allergies.  Asthma.  Germs.  Things that block your nose or sinuses.  Growths in the nose (nasal polyps).  Chemicals or irritants in the air.  Fungus (rare). What increases the risk? You are more likely to develop this condition if:  You have a weak body defense system (immune system).  You do a lot of swimming or diving.  You use nasal sprays too much.  You smoke. What are the signs or symptoms? The main symptoms of this condition are pain and a feeling of pressure around the sinuses. Other symptoms include:  Stuffy nose (congestion).  Runny nose (drainage).  Swelling and warmth in the sinuses.  Headache.  Toothache.  A cough that may get worse at night.  Mucus that collects in the throat or the back of the nose (postnasal drip).  Being unable to smell and taste.  Being very tired (fatigue).  A fever.  Sore throat.  Bad breath. How is this diagnosed? This condition is diagnosed based on:  Your symptoms.  Your medical history.  A physical exam.  Tests to find out if your condition is short-term (acute) or long-term (chronic). Your doctor may: ? Check your nose for growths (polyps). ? Check your sinuses using a tool that has a light (endoscope). ? Check for allergies or germs. ? Do imaging tests, such as an MRI or CT scan. How is this treated? Treatment for this condition  depends on the cause and whether it is short-term or long-term.  If caused by a virus, your symptoms should go away on their own within 10 days. You may be given medicines to relieve symptoms. They include: ? Medicines that shrink swollen tissue in the nose. ? Medicines that treat allergies (antihistamines). ? A spray that treats swelling of the nostrils. ? Rinses that help get rid of thick mucus in your nose (nasal saline washes).  If caused by bacteria, your doctor may wait to see if you will get better without treatment. You may be given antibiotic medicine if you have: ? A very bad infection. ? A weak body defense system.  If caused by growths in the nose, you may need to have surgery. Follow these instructions at home: Medicines  Take, use, or apply over-the-counter and prescription medicines only as told by your doctor. These may include nasal sprays.  If you were prescribed an antibiotic medicine, take it as told by your doctor. Do not stop taking the antibiotic even if you start to feel better. Hydrate and humidify   Drink enough water to keep your pee (urine) pale yellow.  Use a cool mist humidifier to keep the humidity level in your home above 50%.  Breathe in steam for 10-15 minutes, 3-4 times a day, or as told by your doctor. You can do this in the bathroom while a hot shower is running.  Try not to spend time in cool or dry air.  Rest  Rest as much as you can.  Sleep with your head raised (elevated).  Make sure you get enough sleep each night. General instructions   Put a warm, moist washcloth on your face 3-4 times a day, or as often as told by your doctor. This will help with discomfort.  Wash your hands often with soap and water. If there is no soap and water, use hand sanitizer.  Do not smoke. Avoid being around people who are smoking (secondhand smoke).  Keep all follow-up visits as told by your doctor. This is important. Contact a doctor if:  You  have a fever.  Your symptoms get worse.  Your symptoms do not get better within 10 days. Get help right away if:  You have a very bad headache.  You cannot stop throwing up (vomiting).  You have very bad pain or swelling around your face or eyes.  You have trouble seeing.  You feel confused.  Your neck is stiff.  You have trouble breathing. Summary  Sinusitis is swelling of your sinuses. Sinuses are hollow spaces in the bones around your face.  This condition is caused by tissues in your nose that become inflamed or swollen. This traps germs. These can lead to infection.  If you were prescribed an antibiotic medicine, take it as told by your doctor. Do not stop taking it even if you start to feel better.  Keep all follow-up visits as told by your doctor. This is important. This information is not intended to replace advice given to you by your health care provider. Make sure you discuss any questions you have with your health care provider. Document Revised: 03/05/2018 Document Reviewed: 03/05/2018 Elsevier Patient Education  [place].   Laryngitis Laryngitis is irritation and swelling (inflammation) of your vocal cords. This condition causes symptoms such as:  A change in your voice. It may sound low and hoarse.  Loss of voice.  Coughing.  Sore throat.  Dry throat.  Stuffy nose. Depending on the cause, this condition may go away after a short time or may last for more than 3 weeks. Treatment often involves resting your voice and using medicines to soothe your throat. Follow these instructions at home: Medicines  Take over-the-counter and prescription medicines only as told by your doctor.  If you were prescribed an antibiotic medicine, take it as told by your doctor. Do not stop taking it even if you start to feel better. General instructions  Talk as little as possible. Also avoid whispering.  Write instead of talking. Do this until your voice is  back to normal.  Drink enough fluid to keep your pee (urine) pale yellow.  Breathe in moist air. Use a humidifier if you live in a dry climate.  Do not use any products that have nicotine or tobacco in them, such as cigarettes and e-cigarettes. If you need help quitting, ask your doctor. Contact a doctor if:  You have a fever.  Your pain is worse.  Your symptoms do not get better in 2 weeks. Get help right away if:  You cough up blood.  You have trouble swallowing.  You have trouble breathing. Summary  Laryngitis is inflammation of your vocal cords.  This condition causes your voice to sound low and hoarse.  Rest your voice by talking as little as possible. Also avoid whispering. This information is not intended to replace advice given to you by your health care provider. Make sure you discuss any questions you  have with your health care provider. Document Revised: 09/20/2017 Document Reviewed: 09/20/2017 Elsevier Patient Education  2020 [employer].

## 2020-08-13 LAB — OB RESULTS CONSOLE HEPATITIS B SURFACE ANTIGEN: Hepatitis B Surface Ag: NEGATIVE

## 2020-08-13 LAB — OB RESULTS CONSOLE RPR: RPR: NONREACTIVE

## 2020-08-13 LAB — OB RESULTS CONSOLE ANTIBODY SCREEN: Antibody Screen: NEGATIVE

## 2020-08-13 LAB — OB RESULTS CONSOLE RUBELLA ANTIBODY, IGM: Rubella: IMMUNE

## 2020-08-13 LAB — OB RESULTS CONSOLE HIV ANTIBODY (ROUTINE TESTING): HIV: NONREACTIVE

## 2020-08-13 LAB — OB RESULTS CONSOLE ABO/RH: RH Type: POSITIVE

## 2020-10-17 NOTE — L&D Delivery Note (Signed)
Delivery Note At 1:59 PM a viable female was delivered via Vaginal, Spontaneous (Presentation:      ).  APGAR: 8, 9; weight pending.   Placenta status: S, I.  Cord: 3 vessels with the following complications: None.  Cord pH: n/a  Anesthesia: Epidural Episiotomy: n/a  Lacerations:  none Suture Repair: n/a Est. Blood Loss (mL):  100cc  Mom to postpartum.  Baby to Couplet care / Skin to Skin.  Linda Hedges 03/17/2021, 2:09 PM

## 2021-02-24 LAB — OB RESULTS CONSOLE GBS: GBS: NEGATIVE

## 2021-03-16 ENCOUNTER — Encounter (HOSPITAL_COMMUNITY): Payer: Self-pay | Admitting: *Deleted

## 2021-03-16 ENCOUNTER — Telehealth (HOSPITAL_COMMUNITY): Payer: Self-pay | Admitting: *Deleted

## 2021-03-16 NOTE — Telephone Encounter (Signed)
Preadmission screen  

## 2021-03-17 ENCOUNTER — Inpatient Hospital Stay (HOSPITAL_COMMUNITY): Payer: No Typology Code available for payment source | Admitting: Anesthesiology

## 2021-03-17 ENCOUNTER — Inpatient Hospital Stay (HOSPITAL_COMMUNITY): Payer: No Typology Code available for payment source

## 2021-03-17 ENCOUNTER — Encounter (HOSPITAL_COMMUNITY): Payer: Self-pay | Admitting: Obstetrics & Gynecology

## 2021-03-17 ENCOUNTER — Inpatient Hospital Stay (HOSPITAL_COMMUNITY)
Admission: AD | Admit: 2021-03-17 | Discharge: 2021-03-18 | DRG: 807 | Disposition: A | Discharge status: Home / Self Care | Payer: No Typology Code available for payment source | Attending: Obstetrics & Gynecology | Admitting: Obstetrics & Gynecology

## 2021-03-17 ENCOUNTER — Other Ambulatory Visit: Payer: Self-pay

## 2021-03-17 DIAGNOSIS — Z20822 Contact with and (suspected) exposure to covid-19: Secondary | ICD-10-CM | POA: Diagnosis present

## 2021-03-17 DIAGNOSIS — Z3490 Encounter for supervision of normal pregnancy, unspecified, unspecified trimester: Secondary | ICD-10-CM

## 2021-03-17 DIAGNOSIS — O26893 Other specified pregnancy related conditions, third trimester: Secondary | ICD-10-CM | POA: Diagnosis present

## 2021-03-17 DIAGNOSIS — Z3A39 39 weeks gestation of pregnancy: Secondary | ICD-10-CM

## 2021-03-17 HISTORY — DX: Other specified health status: Z78.9

## 2021-03-17 LAB — CBC
HCT: 34.7 % — ABNORMAL LOW (ref 36.0–46.0)
Hemoglobin: 11.5 g/dL — ABNORMAL LOW (ref 12.0–15.0)
MCH: 29.3 pg (ref 26.0–34.0)
MCHC: 33.1 g/dL (ref 30.0–36.0)
MCV: 88.3 fL (ref 80.0–100.0)
Platelets: 163 10*3/uL (ref 150–400)
RBC: 3.93 MIL/uL (ref 3.87–5.11)
RDW: 12.9 % (ref 11.5–15.5)
WBC: 8.3 10*3/uL (ref 4.0–10.5)
nRBC: 0 % (ref 0.0–0.2)

## 2021-03-17 LAB — TYPE AND SCREEN
ABO/RH(D): O POS
Antibody Screen: NEGATIVE

## 2021-03-17 LAB — RESP PANEL BY RT-PCR (FLU A&B, COVID) ARPGX2
Influenza A by PCR: NEGATIVE
Influenza B by PCR: NEGATIVE
SARS Coronavirus 2 by RT PCR: NEGATIVE

## 2021-03-17 LAB — RPR: RPR Ser Ql: NONREACTIVE

## 2021-03-17 MED ORDER — FENTANYL CITRATE (PF) 100 MCG/2ML IJ SOLN
50.0000 ug | INTRAMUSCULAR | Status: DC | PRN
Start: 2021-03-17 — End: 2021-03-17

## 2021-03-17 MED ORDER — IBUPROFEN 600 MG PO TABS
600.0000 mg | ORAL_TABLET | Freq: Four times a day (QID) | ORAL | Status: DC
  Administered 2021-03-17 – 2021-03-18 (×5): 600 mg via ORAL
  Filled 2021-03-17 (×5): qty 1

## 2021-03-17 MED ORDER — OXYTOCIN-SODIUM CHLORIDE 30-0.9 UT/500ML-% IV SOLN
1.0000 m[IU]/min | INTRAVENOUS | Status: DC
Start: 2021-03-17 — End: 2021-03-17
  Administered 2021-03-17: 4 m[IU]/min via INTRAVENOUS
  Administered 2021-03-17: 2 m[IU]/min via INTRAVENOUS

## 2021-03-17 MED ORDER — ACETAMINOPHEN 325 MG PO TABS: 650.0000 mg | ORAL_TABLET | ORAL | Status: DC | PRN

## 2021-03-17 MED ORDER — EPHEDRINE 5 MG/ML INJ: 10.0000 mg | INTRAVENOUS | Status: DC | PRN

## 2021-03-17 MED ORDER — OXYCODONE-ACETAMINOPHEN 5-325 MG PO TABS: 1.0000 | ORAL_TABLET | ORAL | Status: DC | PRN

## 2021-03-17 MED ORDER — SENNOSIDES-DOCUSATE SODIUM 8.6-50 MG PO TABS
2.0000 | ORAL_TABLET | Freq: Every day | ORAL | Status: DC
  Administered 2021-03-18: 2 via ORAL
  Filled 2021-03-17: qty 2

## 2021-03-17 MED ORDER — ONDANSETRON HCL 4 MG/2ML IJ SOLN: 4.0000 mg | Freq: Four times a day (QID) | INTRAMUSCULAR | Status: DC | PRN

## 2021-03-17 MED ORDER — PRENATAL MULTIVITAMIN CH
1.0000 | ORAL_TABLET | Freq: Every day | ORAL | Status: DC
  Administered 2021-03-18: 1 via ORAL
  Filled 2021-03-17: qty 1

## 2021-03-17 MED ORDER — DIPHENHYDRAMINE HCL 25 MG PO CAPS: 25.0000 mg | ORAL_CAPSULE | Freq: Four times a day (QID) | ORAL | Status: DC | PRN

## 2021-03-17 MED ORDER — DIBUCAINE (PERIANAL) 1 % EX OINT: 1.0000 "application " | TOPICAL_OINTMENT | CUTANEOUS | Status: DC | PRN

## 2021-03-17 MED ORDER — FENTANYL-BUPIVACAINE-NACL 0.5-0.125-0.9 MG/250ML-% EP SOLN
12.0000 mL/h | EPIDURAL | Status: DC | PRN
  Administered 2021-03-17: 12 mL/h via EPIDURAL
  Filled 2021-03-17: qty 250

## 2021-03-17 MED ORDER — COCONUT OIL OIL: 1.0000 "application " | TOPICAL_OIL | Status: DC | PRN

## 2021-03-17 MED ORDER — ZOLPIDEM TARTRATE 5 MG PO TABS: 5.0000 mg | ORAL_TABLET | Freq: Every evening | ORAL | Status: DC | PRN

## 2021-03-17 MED ORDER — LACTATED RINGERS IV SOLN: INTRAVENOUS | Status: DC

## 2021-03-17 MED ORDER — LIDOCAINE HCL (PF) 1 % IJ SOLN
INTRAMUSCULAR | Status: DC | PRN
  Administered 2021-03-17: 10 mL via EPIDURAL

## 2021-03-17 MED ORDER — DIPHENHYDRAMINE HCL 50 MG/ML IJ SOLN: 12.5000 mg | INTRAMUSCULAR | Status: DC | PRN

## 2021-03-17 MED ORDER — ONDANSETRON HCL 4 MG PO TABS: 4.0000 mg | ORAL_TABLET | ORAL | Status: DC | PRN

## 2021-03-17 MED ORDER — LACTATED RINGERS IV SOLN
500.0000 mL | INTRAVENOUS | Status: DC | PRN
Start: 2021-03-17 — End: 2021-03-17

## 2021-03-17 MED ORDER — TERBUTALINE SULFATE 1 MG/ML IJ SOLN: 0.2500 mg | Freq: Once | INTRAMUSCULAR | Status: DC | PRN

## 2021-03-17 MED ORDER — OXYTOCIN BOLUS FROM INFUSION
333.0000 mL | Freq: Once | INTRAVENOUS | Status: AC
  Administered 2021-03-17: 333 mL via INTRAVENOUS

## 2021-03-17 MED ORDER — MISOPROSTOL 25 MCG QUARTER TABLET
25.0000 ug | ORAL_TABLET | ORAL | Status: DC | PRN
  Administered 2021-03-17 (×2): 25 ug via VAGINAL
  Filled 2021-03-17 (×2): qty 1

## 2021-03-17 MED ORDER — OXYCODONE-ACETAMINOPHEN 5-325 MG PO TABS: 2.0000 | ORAL_TABLET | ORAL | Status: DC | PRN

## 2021-03-17 MED ORDER — WITCH HAZEL-GLYCERIN EX PADS: 1.0000 "application " | MEDICATED_PAD | CUTANEOUS | Status: DC | PRN

## 2021-03-17 MED ORDER — BENZOCAINE-MENTHOL 20-0.5 % EX AERO
1.0000 "application " | INHALATION_SPRAY | CUTANEOUS | Status: DC | PRN
  Administered 2021-03-17: 1 via TOPICAL
  Filled 2021-03-17: qty 56

## 2021-03-17 MED ORDER — LACTATED RINGERS IV SOLN
500.0000 mL | Freq: Once | INTRAVENOUS | Status: AC
  Administered 2021-03-17: 500 mL via INTRAVENOUS

## 2021-03-17 MED ORDER — SIMETHICONE 80 MG PO CHEW
80.0000 mg | CHEWABLE_TABLET | ORAL | Status: DC | PRN
  Filled 2021-03-17: qty 1

## 2021-03-17 MED ORDER — PHENYLEPHRINE 40 MCG/ML (10ML) SYRINGE FOR IV PUSH (FOR BLOOD PRESSURE SUPPORT): 80.0000 ug | PREFILLED_SYRINGE | INTRAVENOUS | Status: DC | PRN

## 2021-03-17 MED ORDER — LIDOCAINE HCL (PF) 1 % IJ SOLN: 30.0000 mL | INTRAMUSCULAR | Status: DC | PRN

## 2021-03-17 MED ORDER — SOD CITRATE-CITRIC ACID 500-334 MG/5ML PO SOLN: 30.0000 mL | ORAL | Status: DC | PRN

## 2021-03-17 MED ORDER — OXYTOCIN-SODIUM CHLORIDE 30-0.9 UT/500ML-% IV SOLN
2.5000 [IU]/h | INTRAVENOUS | Status: DC
  Filled 2021-03-17: qty 500

## 2021-03-17 MED ORDER — ONDANSETRON HCL 4 MG/2ML IJ SOLN: 4.0000 mg | INTRAMUSCULAR | Status: DC | PRN

## 2021-03-17 MED ORDER — TETANUS-DIPHTH-ACELL PERTUSSIS 5-2.5-18.5 LF-MCG/0.5 IM SUSY: 0.5000 mL | PREFILLED_SYRINGE | Freq: Once | INTRAMUSCULAR | Status: DC

## 2021-03-17 NOTE — Progress Notes (Signed)
Kathleen Sanchez is a 30 y.o. G2P1001 at [redacted]w[redacted]d by ultrasound admitted for induction of labor due to Elective at term.  Subjective: Comfortable with CLEA.  Objective: BP (!) 106/57   Pulse 78   Temp 98.5 F (36.9 C) (Oral)   Resp 16   Ht 5\' 4"  (1.626 m)   Wt 70 kg   LMP 04/13/2020 (Exact Date)   BMI 26.50 kg/m  No intake/output data recorded. No intake/output data recorded.  FHT:  FHR: 115 bpm, variability: moderate,  accelerations:  Abscent,  decelerations:  Present variable UC:   regular, every 2 minutes SVE:   7/c/+1  Labs: Lab Results  Component Value Date   WBC 8.3 03/17/2021   HGB 11.5 (L) 03/17/2021   HCT 34.7 (L) 03/17/2021   MCV 88.3 03/17/2021   PLT 163 03/17/2021    Assessment / Plan: Induction of labor due to elective IOL,  progressing well on pitocin  Labor: Progressing normally Preeclampsia:  n/a Fetal Wellbeing:  Category I Pain Control:  Epidural I/D:  n/a Anticipated MOD:  NSVD  Linda Hedges 03/17/2021, 1:27 PM

## 2021-03-17 NOTE — Lactation Note (Signed)
This note was copied from a baby's chart. Lactation Consultation Note  Patient Name: Kathleen Sanchez Date: 03/17/2021   When Ruso entered room, lights were off and mom was in bed resting.  Mom asked if LC could come back later so she could rest.  Lactation to follow up with family.   Age:32 hours  Maternal Data    Feeding    LATCH Score                    Lactation Tools Discussed/Used    Interventions    Discharge    Consult Status      [name] [facility] 03/17/2021, 10:46 PM

## 2021-03-17 NOTE — Progress Notes (Signed)
Amahia Madonia is a 30 y.o. G2P1001 at [redacted]w[redacted]d by ultrasound admitted for induction of labor due to Elective at term.  Subjective: Starting to feel stronger CTX.  Objective: BP 129/74   Pulse 98   Temp 98.5 F (36.9 C) (Oral)   Resp 16   Ht 5\' 4"  (1.626 m)   Wt 70 kg   LMP 04/13/2020 (Exact Date)   BMI 26.50 kg/m  No intake/output data recorded. No intake/output data recorded.  FHT:  FHR: 135 bpm, variability: moderate,  accelerations:  Present,  decelerations:  Absent UC:   irregular, every 3-5 minutes SVE:   3/75/-2, AROM, clear  Labs: Lab Results  Component Value Date   WBC 8.3 03/17/2021   HGB 11.5 (L) 03/17/2021   HCT 34.7 (L) 03/17/2021   MCV 88.3 03/17/2021   PLT 163 03/17/2021    Assessment / Plan: Induction of labor; elective  Labor: Progressing normally and s/p VMP x 2.  s/p AROM and pitocin started Preeclampsia:  n/a Fetal Wellbeing:  Category I Pain Control:  Planning CLEA I/D:  n/a Anticipated MOD:  NSVD  Linda Hedges 03/17/2021, 9:40 AM

## 2021-03-17 NOTE — Lactation Note (Signed)
This note was copied from a baby's chart. Lactation Consultation Note  Patient Name: Kathleen Sanchez [id number] Date: 03/17/2021 Reason for consult: L&D Initial assessment;Term Age:32 hours  Mom is a P2 who nursed her 1st child for about 2 months. Mom mentioned not having enough milk with her 1st child, but I did not ask many questions due to the nature of the visit in L&D.  Using the teacup hold, infant latched with ease. Mom was comfortable with latch. Swallows verified by cervical auscultation. Infant (parents are currently undecided on his  name) latched to both breasts while I was in the room.   Specifics of an asymmetric latch were shown via [employer].   Lactation to f/u later today.  Matthias Hughs [facility] 03/17/2021, 2:45 PM

## 2021-03-17 NOTE — Anesthesia Preprocedure Evaluation (Signed)
Anesthesia Evaluation  Patient identified by MRN, date of birth, ID band Patient awake    Reviewed: Allergy & Precautions, H&P , NPO status , Patient's Chart, lab work & pertinent test results  History of Anesthesia Complications Negative for: history of anesthetic complications  Airway Mallampati: II  TM Distance: >3 FB Neck ROM: full    Dental no notable dental hx.    Pulmonary neg pulmonary ROS,    Pulmonary exam normal        Cardiovascular negative cardio ROS Normal cardiovascular exam Rhythm:regular Rate:Normal     Neuro/Psych negative neurological ROS  negative psych ROS   GI/Hepatic negative GI ROS, Neg liver ROS,   Endo/Other  negative endocrine ROS  Renal/GU negative Renal ROS  negative genitourinary   Musculoskeletal   Abdominal   Peds  Hematology negative hematology ROS (+)   Anesthesia Other Findings   Reproductive/Obstetrics (+) Pregnancy                             Anesthesia Physical Anesthesia Plan  ASA: II  Anesthesia Plan: Epidural   Post-op Pain Management:    Induction:   PONV Risk Score and Plan:   Airway Management Planned:   Additional Equipment:   Intra-op Plan:   Post-operative Plan:   Informed Consent: I have reviewed the patients History and Physical, chart, labs and discussed the procedure including the risks, benefits and alternatives for the proposed anesthesia with the patient or authorized representative who has indicated his/her understanding and acceptance.       Plan Discussed with:   Anesthesia Plan Comments:         Anesthesia Quick Evaluation

## 2021-03-17 NOTE — Anesthesia Procedure Notes (Signed)
Epidural Patient location during procedure: OB Start time: 03/17/2021 10:26 AM End time: 03/17/2021 10:42 AM  Staffing Anesthesiologist: Lidia Collum, MD Performed: anesthesiologist   Preanesthetic Checklist Completed: patient identified, IV checked, risks and benefits discussed, monitors and equipment checked, pre-op evaluation and timeout performed  Epidural Patient position: sitting Prep: DuraPrep Patient monitoring: heart rate, continuous pulse ox and blood pressure Approach: midline Location: L3-L4 Injection technique: LOR air  Needle:  Needle type: Tuohy  Needle gauge: 17 G Needle length: 9 cm Needle insertion depth: 5 cm Catheter type: closed end flexible Catheter size: 20 Guage Catheter at skin depth: 10 cm Test dose: negative  Assessment Events: blood not aspirated, injection not painful, no injection resistance, no paresthesia and negative IV test  Additional Notes Reason for block:procedure for pain

## 2021-03-17 NOTE — H&P (Signed)
Kathleen Sanchez is a 30 y.o. female G2P1001 at [redacted]w[redacted]d presenting for elective IOL.  Patient received second dose of VMP at 0545 and reports mild cramping.  No LOF or VB.  Active FM.  Antepartum course uncomplicated.  GBS negative.  OB History    Gravida  2   Para  1   Term  1   Preterm      AB      Living  1     SAB      IAB      Ectopic      Multiple  0   Live Births  1          Past Medical History:  Diagnosis Date  . Medical history non-contributory    Past Surgical History:  Procedure Laterality Date  . NO PAST SURGERIES     Family History: family history includes Anxiety disorder in her maternal grandmother; Cancer in her paternal grandfather; Diabetes in her paternal grandmother; Heart attack in her paternal grandmother; Hyperlipidemia in her father; Melanoma in her mother; Osteoarthritis in her maternal grandfather. Social History:  reports that she has never smoked. She has never used smokeless tobacco. She reports previous alcohol use. She reports that she does not use drugs.     Maternal Diabetes: No Genetic Screening: Normal Maternal Ultrasounds/Referrals: Normal Fetal Ultrasounds or other Referrals:  None Maternal Substance Abuse:  No Significant Maternal Medications:  None Significant Maternal Lab Results:  Group B Strep negative Other Comments:  None  Review of Systems Maternal Medical History:  Contractions: Onset was 3-5 hours ago.   Frequency: irregular.   Perceived severity is mild.    Fetal activity: Perceived fetal activity is normal.   Last perceived fetal movement was within the past hour.    Prenatal complications: no prenatal complications Prenatal Complications - Diabetes: none.    Dilation: 1.5 Effacement (%): 50 Station: -3 Exam by:: Nadara Mustard, RN Blood pressure 129/74, pulse 98, temperature 98.5 F (36.9 C), temperature source Oral, resp. rate 16, height 5\' 4"  (1.626 m), weight 70 kg, last menstrual period  04/13/2020. Maternal Exam:  Uterine Assessment: Contraction strength is mild.  Contraction frequency is irregular.   Abdomen: Patient reports no abdominal tenderness. Fundal height is c/w dates.   Estimated fetal weight is 7#.       Fetal Exam Fetal Monitor Review: Baseline rate: 125.  Variability: moderate (6-25 bpm).   Pattern: accelerations present and no decelerations.    Fetal State Assessment: Category I - tracings are normal.     Physical Exam Constitutional:      Appearance: Normal appearance.  HENT:     Head: Normocephalic and atraumatic.  Pulmonary:     Effort: Pulmonary effort is normal.  Abdominal:     Palpations: Abdomen is soft.  Musculoskeletal:        General: Normal range of motion.     Cervical back: Normal range of motion.  Skin:    General: Skin is warm and dry.  Neurological:     Mental Status: She is alert and oriented to person, place, and time.  Psychiatric:        Mood and Affect: Mood normal.        Behavior: Behavior normal.     Prenatal labs: ABO, Rh: --/--/O POS (06/01 0051) Antibody: NEG (06/01 0051) Rubella: Immune (10/28 0000) RPR: Nonreactive (10/28 0000)  HBsAg: Negative (10/28 0000)  HIV: Non-reactive (10/28 0000)  GBS: Negative/-- (05/11 0000)   Assessment/Plan: 83TD  G2P1001 at [redacted]w[redacted]d for elective IOL -Recheck cvx at 0945 and AROM if able -Planning CLEA -Anticipate NSVD   Linda Hedges 03/17/2021, 7:54 AM

## 2021-03-18 LAB — CBC
HCT: 31.3 % — ABNORMAL LOW (ref 36.0–46.0)
Hemoglobin: 10.5 g/dL — ABNORMAL LOW (ref 12.0–15.0)
MCH: 29.7 pg (ref 26.0–34.0)
MCHC: 33.5 g/dL (ref 30.0–36.0)
MCV: 88.4 fL (ref 80.0–100.0)
Platelets: 138 10*3/uL — ABNORMAL LOW (ref 150–400)
RBC: 3.54 MIL/uL — ABNORMAL LOW (ref 3.87–5.11)
RDW: 13 % (ref 11.5–15.5)
WBC: 9.8 10*3/uL (ref 4.0–10.5)
nRBC: 0 % (ref 0.0–0.2)

## 2021-03-18 MED ORDER — IBUPROFEN 600 MG PO TABS: 600.0000 mg | ORAL_TABLET | Freq: Four times a day (QID) | ORAL | 0 refills | Status: DC | PRN

## 2021-03-18 NOTE — Lactation Note (Signed)
This note was copied from a baby's chart. Lactation Consultation Note  Patient Name: Kathleen Sanchez [id number] Date: 03/18/2021 Reason for consult: Initial assessment;Term;Infant weight loss Age:32 hours  Per mom would like to go hone early/  Baby latched when LC entered the room and per mom 2nd breast.  LC showed mom breast compressions to enhance flow/ per mom comfortable.  When baby was release, nipple slightly creased and tiny intact blister.  LC encouraged prior to latch - breast massage, hand express,Latch.  See below for D/C teaching.  Mom has the [facility] brochure with resource numbers.   Maternal Data Has patient been taught Hand Expression?: Yes Does the patient have breastfeeding experience prior to this delivery?: Yes How long did the patient breastfeed?: 2 months  Feeding Mother's Current Feeding Choice: Breast Milk  LATCH Score Latch:  (latched with depth)  Audible Swallowing:  (swallows noted)     Comfort (Breast/Nipple):  (per mom comfortable)  Hold (Positioning):  (mom indepedent)      Lactation Tools Discussed/Used    Interventions Interventions: Breast feeding basics reviewed;Education  Discharge Pump: Personal;DEBP WIC Program: No  Consult Status Consult Status: Follow-up Date: 03/19/21 Follow-up type: In-patient    [place] 03/18/2021, 9:13 AM

## 2021-03-18 NOTE — Discharge Summary (Signed)
Postpartum Discharge Summary    Patient Name: Kathleen Sanchez DOB: 12-03-1990 MRN: [id number]  Date of admission: 03/17/2021 Delivery date:03/17/2021  Delivering provider: Linda Hedges  Date of discharge: 03/18/2021  Admitting diagnosis: Pregnancy [Z34.90] Intrauterine pregnancy: [redacted]w[redacted]d    Secondary diagnosis:  Active Problems:   Pregnancy  Additional problems:      Discharge diagnosis: Term Pregnancy Delivered                                              Post partum procedures:  Augmentation: AROM, Pitocin and Cytotec Complications: None  Hospital course: Induction of Labor With Vaginal Delivery   30y.o. yo G2P1001 at 32w6das admitted to the hospital 03/17/2021 for induction of labor.  Indication for induction: Elective.  Patient had an uncomplicated labor course as follows: Membrane Rupture Time/Date: 9:39 AM ,03/17/2021   Delivery Method:Vaginal, Spontaneous  Episiotomy: None  Lacerations:  None  Details of delivery can be found in separate delivery note.  Patient had a routine postpartum course. Patient is discharged home 03/18/21.  Newborn Data: Birth date:03/17/2021  Birth time:1:59 PM  Gender:Female  Living status:Living  Apgars:8 ,9  Weight:3501 g   Magnesium Sulfate received: No BMZ received: No Rhophylac:N/A MMR:N/A T-DaP:Given prenatally Flu: Yes Transfusion:No  Physical exam  Vitals:   03/17/21 1809 03/17/21 2033 03/18/21 0012 03/18/21 0454  BP: 114/76 114/68 (!) 95/59 104/70  Pulse: 86 89 64 84  Resp: _0 Temp: 98 F (36.7 C) 98.6 F (37 C) 98.8 F (37.1 C) 98.5 F (36.9 C)  TempSrc: Oral Oral Oral Oral  SpO2:  100% 99%   Weight:      Height:       General: alert, cooperative and no distress Lochia: appropriate Uterine Fundus: firm Incision: Healing well with no significant drainage DVT Evaluation: No evidence of DVT seen on physical exam. Labs: Lab Results  Component Value Date   WBC 9.8 03/18/2021   HGB 10.5 (L) 03/18/2021    HCT 31.3 (L) 03/18/2021   MCV 88.4 03/18/2021   PLT 138 (L) 03/18/2021   No flowsheet data found. Edinburgh Score: Edinburgh Postnatal Depression Scale Screening Tool 03/17/2021  I have been able to laugh and see the funny side of things. 0  I have looked forward with enjoyment to things. 0  I have blamed myself unnecessarily when things went wrong. 0  I have been anxious or worried for no good reason. 1  I have felt scared or panicky for no good reason. 0  Things have been getting on top of me. 0  I have been so unhappy that I have had difficulty sleeping. 0  I have felt sad or miserable. 0  I have been so unhappy that I have been crying. 0  The thought of harming myself has occurred to me. 0  Edinburgh Postnatal Depression Scale Total 1      After visit meds:  Allergies as of 03/18/2021   No Known Allergies     Medication List    STOP taking these medications   amoxicillin-clavulanate 875-125 MG tablet Commonly known as: AUGMENTIN   ipratropium 0.06 % nasal spray Commonly known as: ATROVENT     TAKE these medications   ibuprofen 600 MG tablet Commonly known as: ADVIL Take 1 tablet (600 mg total) by mouth every 6 (six) hours  as needed for mild pain, moderate pain or cramping.   prenatal multivitamin Tabs tablet Take 1 tablet by mouth daily at 12 noon.        Discharge home in stable condition Infant Feeding: Breast Infant Disposition:home with mother Discharge instruction: per After Visit Summary and Postpartum booklet. Activity: Advance as tolerated. Pelvic rest for 6 weeks.  Diet: routine diet Anticipated Birth Control: Unsure Postpartum Appointment:6 weeks Additional Postpartum F/U:   Future Appointments:No future appointments. Follow up Visit:      03/18/2021 Luz Lex, MD

## 2021-03-18 NOTE — Anesthesia Postprocedure Evaluation (Signed)
Anesthesia Post Note  Patient: Deronda Christian  Procedure(s) Performed: AN AD [place]     Patient location during evaluation: Mother Baby Anesthesia Type: Epidural Level of consciousness: awake and alert Pain management: pain level controlled Vital Signs Assessment: post-procedure vital signs reviewed and stable Respiratory status: spontaneous breathing, nonlabored ventilation and respiratory function stable Cardiovascular status: stable Postop Assessment: no headache, no backache, epidural receding, no apparent nausea or vomiting, patient able to bend at knees, adequate PO intake and able to ambulate Anesthetic complications: no   No complications documented.  Last Vitals:  Vitals:   03/18/21 0012 03/18/21 0454  BP: (!) 95/59 104/70  Pulse: 64 84  Resp: 17 19  Temp: 37.1 C 36.9 C  SpO2: 99%     Last Pain:  Vitals:   03/18/21 0712  TempSrc:   PainSc: 0-No pain   Pain Goal:                   Jabier Mutton

## 2021-03-31 ENCOUNTER — Other Ambulatory Visit (HOSPITAL_COMMUNITY): Payer: Self-pay | Admitting: Pulmonary Disease

## 2021-03-31 ENCOUNTER — Other Ambulatory Visit (HOSPITAL_COMMUNITY): Payer: Self-pay

## 2021-03-31 DIAGNOSIS — M7989 Other specified soft tissue disorders: Secondary | ICD-10-CM

## 2021-03-31 DIAGNOSIS — M79604 Pain in right leg: Secondary | ICD-10-CM

## 2021-04-01 ENCOUNTER — Ambulatory Visit (HOSPITAL_COMMUNITY): Payer: No Typology Code available for payment source | Attending: Physician Assistant

## 2021-04-05 ENCOUNTER — Other Ambulatory Visit: Payer: Self-pay

## 2021-04-05 ENCOUNTER — Ambulatory Visit (HOSPITAL_COMMUNITY)
Admission: RE | Admit: 2021-04-05 | Discharge: 2021-04-05 | Disposition: A | Discharge status: Home / Self Care | Payer: No Typology Code available for payment source | Source: Ambulatory Visit | Attending: Obstetrics and Gynecology | Admitting: Obstetrics and Gynecology

## 2021-04-05 DIAGNOSIS — M79604 Pain in right leg: Secondary | ICD-10-CM | POA: Diagnosis not present

## 2021-04-05 DIAGNOSIS — M7989 Other specified soft tissue disorders: Secondary | ICD-10-CM | POA: Diagnosis not present

## 2021-04-05 NOTE — Progress Notes (Signed)
Bilateral lower extremity venous duplex completed. Refer to "CV Proc" under chart review to view preliminary results.  04/05/2021 11:41 AM Kelby Aline., MHA, RVT, RDCS, RDMS

## 2022-06-08 IMAGING — US US EXTREM LOW*R* LIMITED
1 series · 6 of 6 positions shown · non-contrast
Comparison: None.

CLINICAL DATA: Palpable right thigh soft tissue mass.

EXAM:
ULTRASOUND RIGHT LOWER EXTREMITY LIMITED
TECHNIQUE: Ultrasound examination of the lower extremity soft tissues was
performed in the area of clinical concern.

[Series 1: us extrem low*right* limited · 0.07mm/px · 6 of 6 slices shown]
[im 1/6]
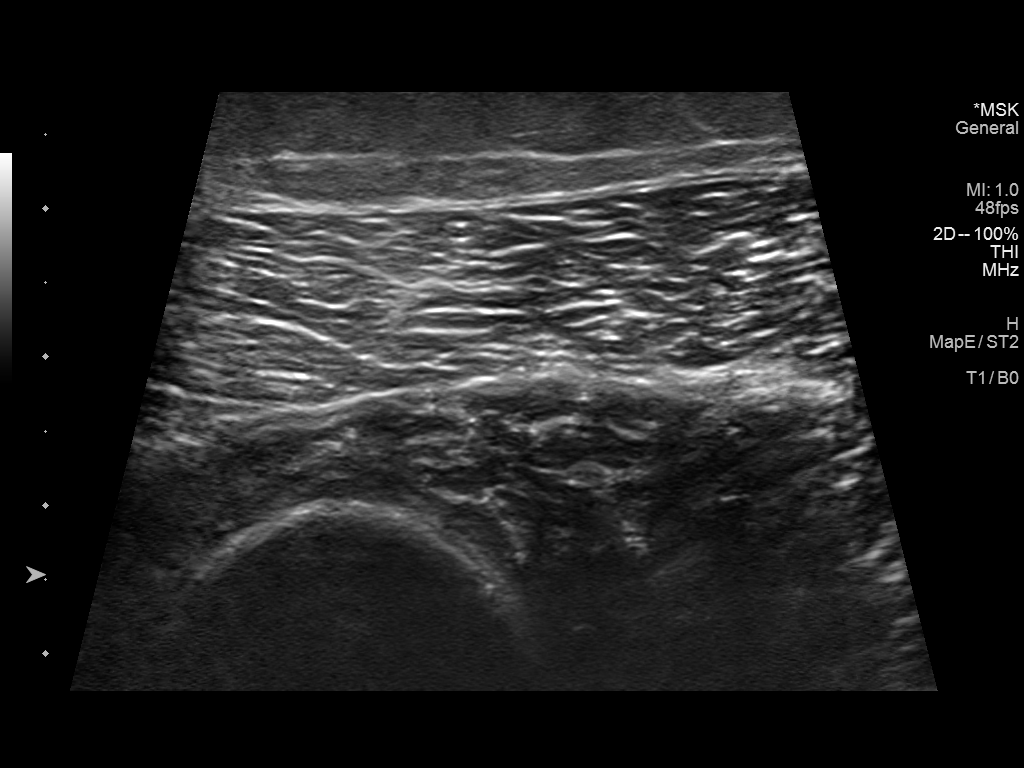
[im 2/6]
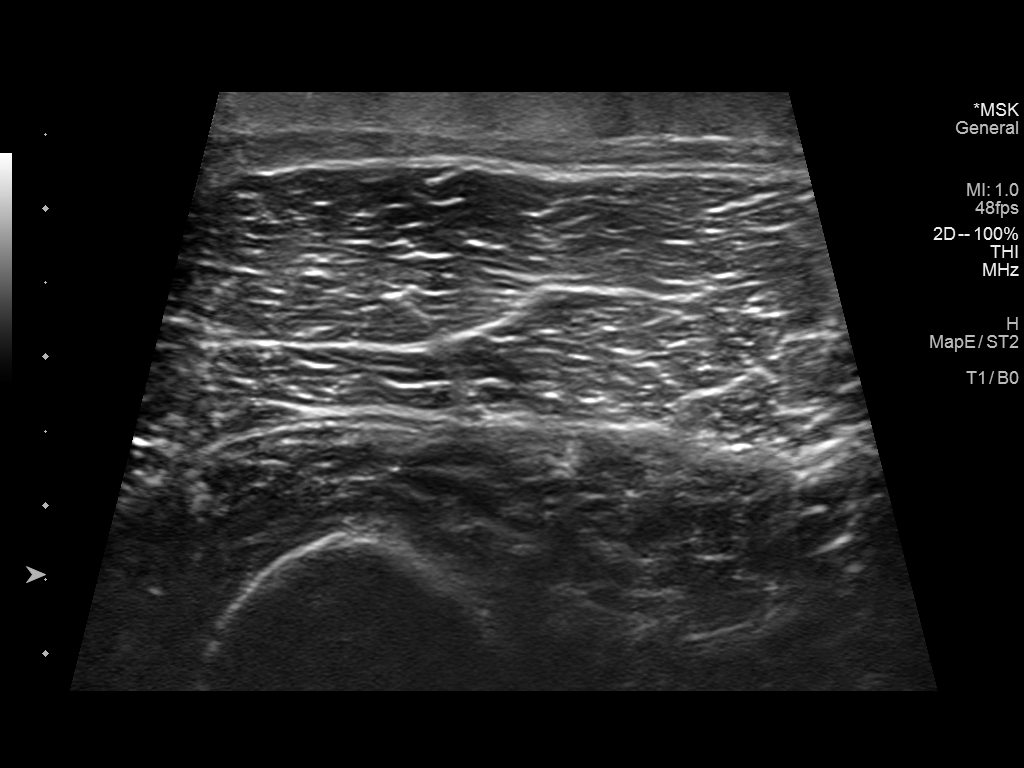
[im 3/6]
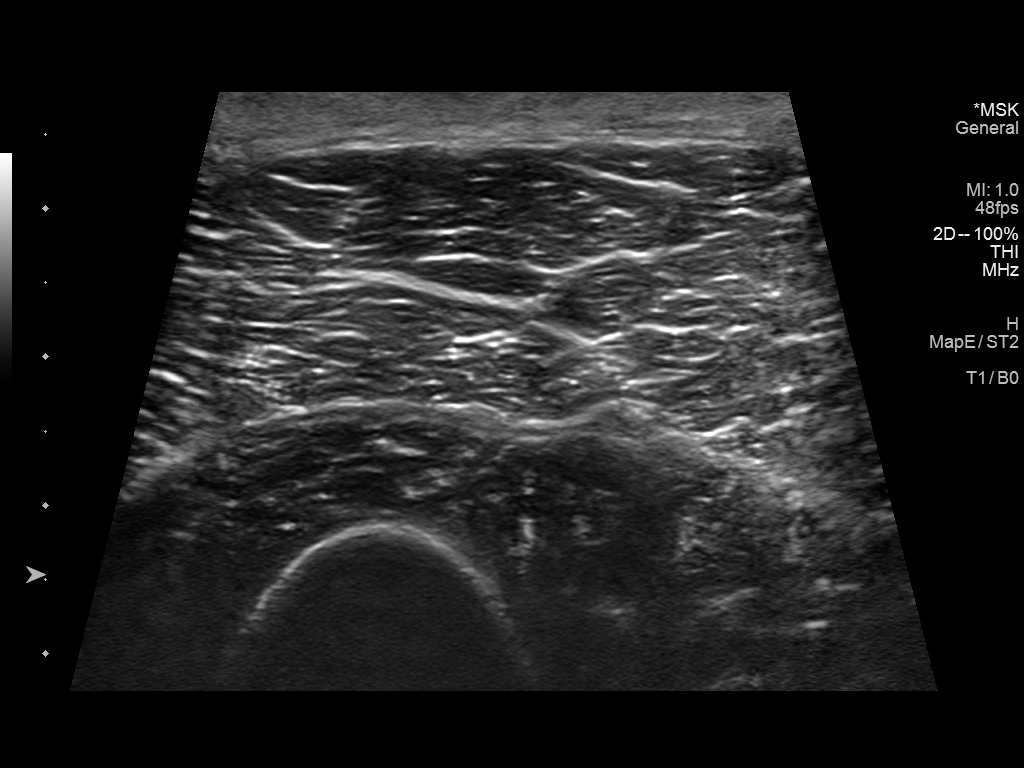
[im 4/6]
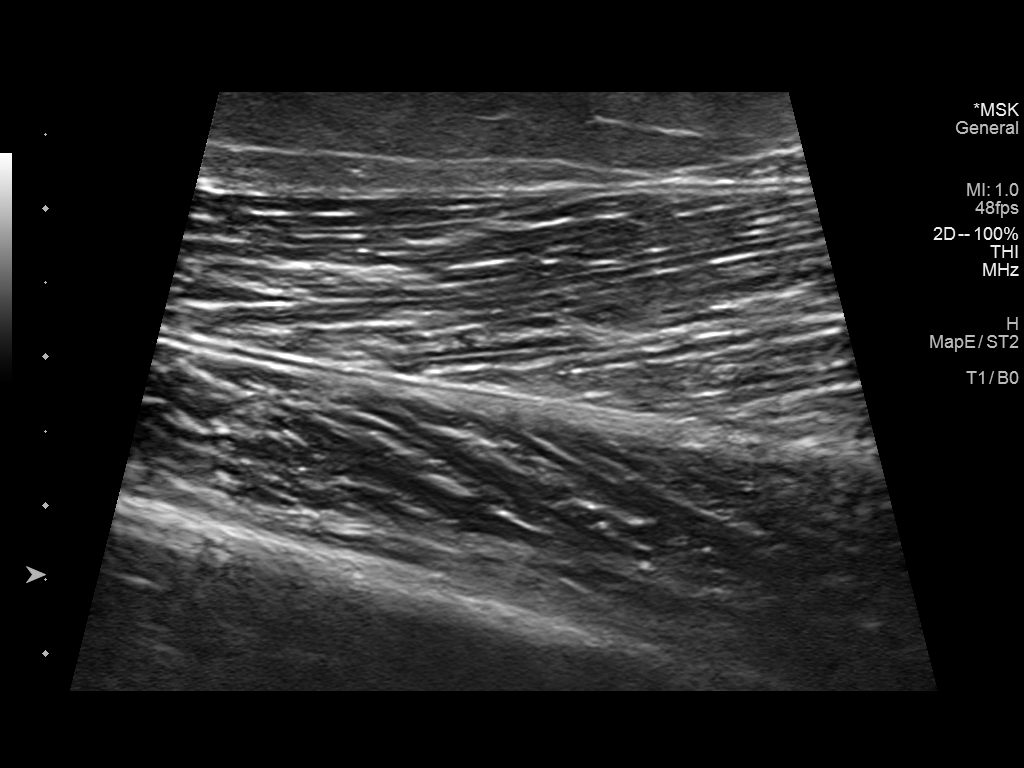
[im 5/6]
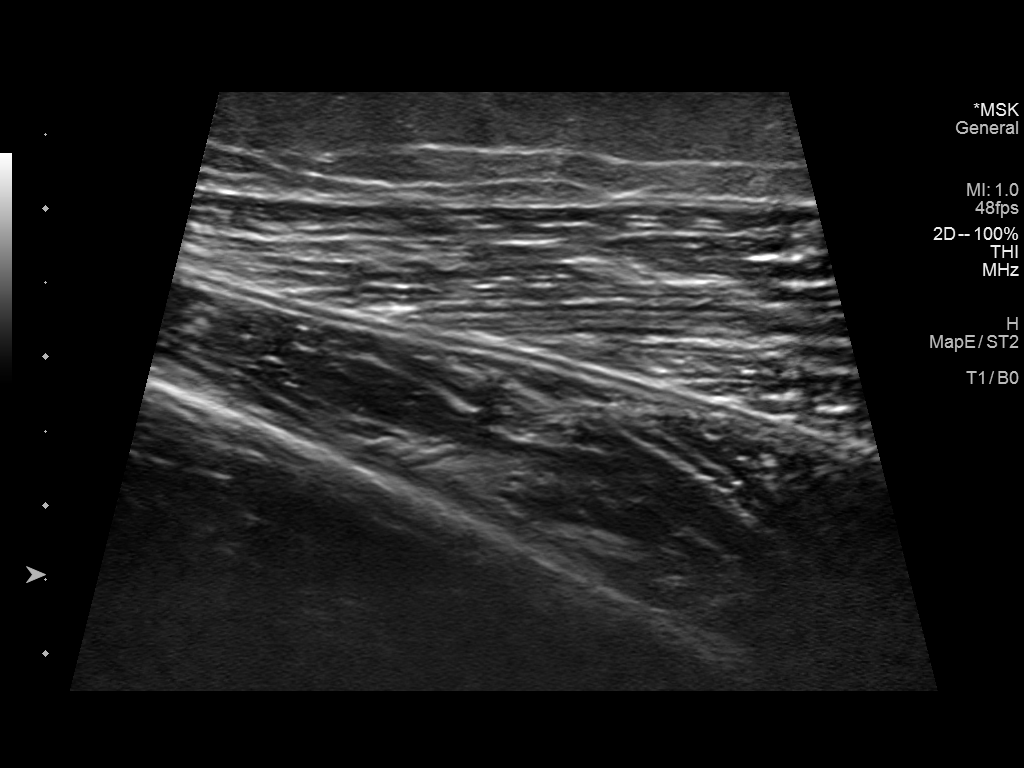
[im 6/6]
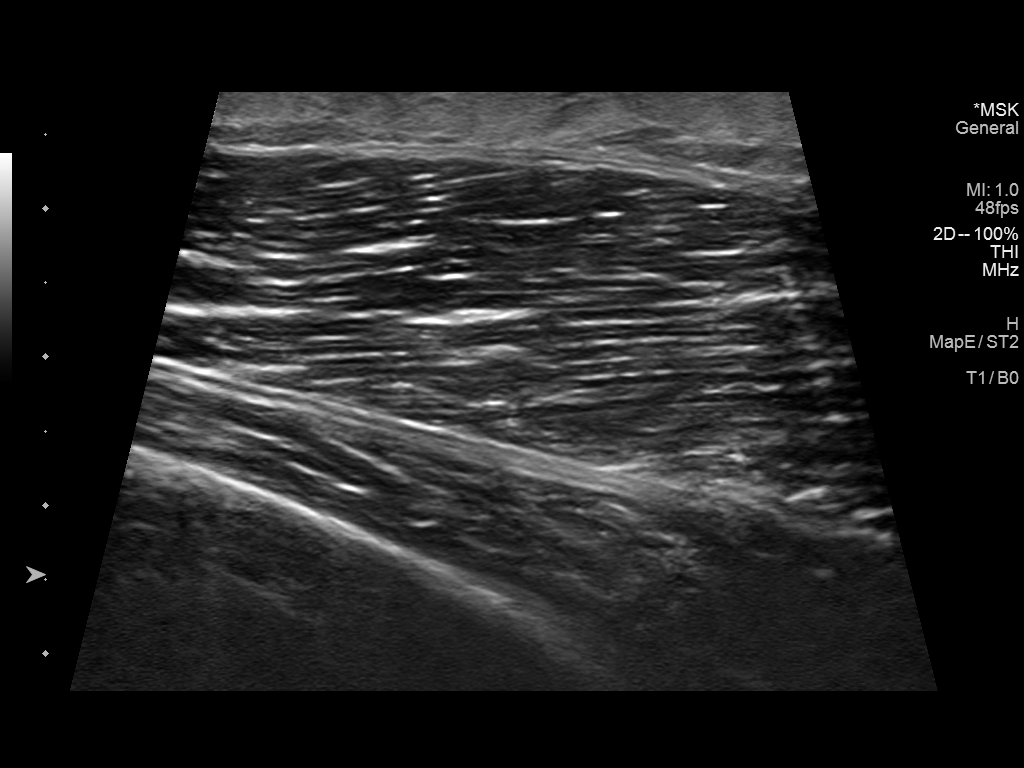

[6 of 6 positions shown; findings below may reference images not displayed]

FINDINGS: Joint Space: Not evaluated.

Muscles: Normal.

Tendons: Not evaluated.

Other Soft Tissue Structures: Scanning over the palpable area of
clinical concern in the right thigh demonstrates no focal solid mass
or fluid collection.
IMPRESSION: No focal abnormality over the soft tissues of the right thigh in the
area of patient's palpable abnormality.

## 2022-09-19 ENCOUNTER — Ambulatory Visit: Payer: No Typology Code available for payment source | Admitting: Podiatry

## 2022-09-19 ENCOUNTER — Ambulatory Visit: Payer: No Typology Code available for payment source | Admitting: Physician Assistant

## 2022-09-19 ENCOUNTER — Ambulatory Visit (INDEPENDENT_AMBULATORY_CARE_PROVIDER_SITE_OTHER): Payer: No Typology Code available for payment source

## 2022-09-19 VITALS — BP 128/78

## 2022-09-19 DIAGNOSIS — S92812A Other fracture of left foot, initial encounter for closed fracture: Secondary | ICD-10-CM

## 2022-09-19 DIAGNOSIS — M79672 Pain in left foot: Secondary | ICD-10-CM | POA: Insufficient documentation

## 2022-09-19 MED ORDER — MELOXICAM 15 MG PO TABS: 15.0000 mg | ORAL_TABLET | Freq: Every day | ORAL | 1 refills | Status: DC

## 2022-09-19 NOTE — Progress Notes (Incomplete)
Kathleen Sanchez is a 31 y.o. female here for a {New prob or follow up:31724}.  History of Present Illness:   No chief complaint on file.   HPI  Foot pain  Past Medical History:  Diagnosis Date   Medical history non-contributory      Social History   Tobacco Use   Smoking status: Never   Smokeless tobacco: Never  Vaping Use   Vaping Use: Never used  Substance Use Topics   Alcohol use: Not Currently   Drug use: Never    Past Surgical History:  Procedure Laterality Date   NO PAST SURGERIES      Family History  Problem Relation Age of Onset   Melanoma Mother    Anxiety disorder Maternal Grandmother    Diabetes Paternal Grandmother    Heart attack Paternal Grandmother    Cancer Paternal Grandfather        unknown   Hyperlipidemia Father    Osteoarthritis Maternal Grandfather    Breast cancer Neg Hx     No Known Allergies  Current Medications:   Current Outpatient Medications:    ibuprofen (ADVIL) 600 MG tablet, Take 1 tablet (600 mg total) by mouth every 6 (six) hours as needed for mild pain, moderate pain or cramping., Disp: 30 tablet, Rfl: 0   Prenatal Vit-Fe Fumarate-FA (PRENATAL MULTIVITAMIN) TABS tablet, Take 1 tablet by mouth daily at 12 noon., Disp: , Rfl:    Review of Systems:   ROS  Vitals:   There were no vitals filed for this visit.   There is no height or weight on file to calculate BMI.  Physical Exam:   Physical Exam  Assessment and Plan:   ***   I,Moesha Myer,acting as a scribe for Inda Coke, PA.,have documented all relevant documentation on the behalf of Inda Coke, PA,as directed by  Inda Coke, PA while in the presence of Inda Coke, [place].   ***

## 2022-09-19 NOTE — Progress Notes (Signed)
   Chief Complaint  Patient presents with   Foot Pain    Left foot pain under the ball of the foot     HPI: 31 y.o. female presenting today as a new patient for evaluation of left foot pain.  Patient states that she woke up yesterday morning 09/18/2022 with pain and tenderness to the forefoot.  Denies a history of injury.  Patient states that she did a significant amount of walking and shopping yesterday throughout the day and noticed increased pain with swelling.  She presents today as an urgent working  Past Medical History:  Diagnosis Date   Medical history non-contributory     Past Surgical History:  Procedure Laterality Date   NO PAST SURGERIES      No Known Allergies   Physical Exam: General: The patient is alert and oriented x3 in no acute distress.  Dermatology: Skin is warm, dry and supple bilateral lower extremities. Negative for open lesions or macerations.  Vascular: Palpable pedal pulses bilaterally. Capillary refill within normal limits.  Edema noted surrounding the sesamoidal apparatus left foot plantar aspect.  No edema or erythema to the dorsal aspect of the first MTP  Neurological: Light touch and protective threshold grossly intact  Musculoskeletal Exam: No pedal deformities noted.  Tenderness with light touch and palpation along the sesamoid apparatus and fibular sesamoid left.  There is no pain or tenderness to the dorsal aspect of the joint  Radiographic Exam LT foot 09/19/2022:  Normal osseous mineralization.  Comminuted type fracture noted to the fibular sesamoid best visualized on oblique view.  Assessment: 1.  Fibular sesamoid fracture, closed, initial encounter left   Plan of Care:  1. Patient evaluated. X-Rays reviewed in detail with the patient today.  2.  Cam boot dispensed.  WBAT x 6 weeks 3.  Prescription for meloxicam 15 mg daily 4.  Return to clinic in 6 weeks for follow-up x-ray.  If there is no improvement may need to order MRI       Edrick Kins, DPM [facility]  Dr. Edrick Kins, DPM    [address], [place] [address]                Office [phone]  Fax [phone]

## 2022-09-29 ENCOUNTER — Encounter: Payer: Self-pay | Admitting: *Deleted

## 2022-11-02 ENCOUNTER — Other Ambulatory Visit: Payer: Self-pay | Admitting: Podiatry

## 2022-11-02 ENCOUNTER — Ambulatory Visit: Payer: No Typology Code available for payment source | Admitting: Podiatry

## 2022-11-02 ENCOUNTER — Ambulatory Visit (INDEPENDENT_AMBULATORY_CARE_PROVIDER_SITE_OTHER): Payer: No Typology Code available for payment source

## 2022-11-02 VITALS — BP 113/80 | HR 96

## 2022-11-02 DIAGNOSIS — S92812A Other fracture of left foot, initial encounter for closed fracture: Secondary | ICD-10-CM

## 2022-11-02 NOTE — Progress Notes (Signed)
   Chief Complaint  Patient presents with   Fracture    Closed fracture left foot, patient still is having pain when walking with or without the cam boot, rate of pain 4 out of 10, X-Rays taken today TX: Cam boot, Mobic     HPI: 32 y.o. female presenting today for follow-up evaluation of fibular sesamoid fracture left foot.  Patient has been WBAT in the cam boot as instructed.  There has been improvement but she continues to experience pain to this area.  Presenting for further treatment and evaluation  Patient states that she woke up morning of 09/18/2022 with pain and tenderness to the forefoot.  Denies a history of injury.  Patient states that she did a significant amount of walking and shopping yesterday throughout the day and noticed increased pain with swelling.   Past Medical History:  Diagnosis Date   Medical history non-contributory     Past Surgical History:  Procedure Laterality Date   NO PAST SURGERIES      No Known Allergies   Physical Exam: General: The patient is alert and oriented x3 in no acute distress.  Dermatology: Skin is warm, dry and supple bilateral lower extremities. Negative for open lesions or macerations.  Vascular: Palpable pedal pulses bilaterally. Capillary refill within normal limits.  Edema noted surrounding the sesamoidal apparatus left foot plantar aspect.  No edema or erythema to the dorsal aspect of the first MTP  Neurological: Light touch and protective threshold grossly intact  Musculoskeletal Exam: No pedal deformities noted.  There continues to be tenderness with light touch and palpation along the sesamoid apparatus and fibular sesamoid left.  There is no pain or tenderness to the dorsal aspect of the joint  Radiographic Exam LT foot 09/19/2022: Normal osseous mineralization.  Comminuted type fracture noted to the fibular sesamoid best visualized on oblique view. Radiographic exam LT foot 11/02/2022: Essentially unchanged from prior x-rays  with the exception that there is some slight routine healing along the fracture sites of the fibular sesamoid  Assessment: 1.  Fibular sesamoid fracture left, closed, subsequent encounter with routine healing -Patient evaluated.  X-rays reviewed in detail and compared to prior x-rays.  There is some evidence of routine healing -Continue WBAT cam boot -Continue meloxicam 15 mg daily as needed -Return to clinic 8 weeks follow-up x-ray  *Going to [place] in May 2024.      Edrick Kins, DPM [facility]  Dr. Edrick Kins, DPM    [address], [place] [address]                Office [phone]  Fax [phone]

## 2022-11-08 ENCOUNTER — Other Ambulatory Visit: Payer: Self-pay | Admitting: Podiatry

## 2022-11-08 DIAGNOSIS — S92812A Other fracture of left foot, initial encounter for closed fracture: Secondary | ICD-10-CM

## 2022-12-28 ENCOUNTER — Ambulatory Visit: Payer: No Typology Code available for payment source

## 2022-12-28 ENCOUNTER — Ambulatory Visit: Payer: No Typology Code available for payment source | Admitting: Podiatry

## 2022-12-28 DIAGNOSIS — Q667 Congenital pes cavus, unspecified foot: Secondary | ICD-10-CM

## 2022-12-28 DIAGNOSIS — S92812A Other fracture of left foot, initial encounter for closed fracture: Secondary | ICD-10-CM

## 2022-12-28 NOTE — Progress Notes (Signed)
   No chief complaint on file.   HPI: 31 y.o. female presenting today for follow-up evaluation of fibular sesamoid fracture left foot.  Patient has been WBAT in the cam boot as instructed.  Patient continues to state that there has been some improvement.  She has noticed significant reduction in pain.  She has not tried to get out of the cam boot yet.  Patient states that she woke up morning of 09/18/2022 with pain and tenderness to the forefoot.  Denies a history of injury.  Patient states that she did a significant amount of walking and shopping yesterday throughout the day and noticed increased pain with swelling.   Past Medical History:  Diagnosis Date   Medical history non-contributory     Past Surgical History:  Procedure Laterality Date   NO PAST SURGERIES      No Known Allergies   Physical Exam: General: The patient is alert and oriented x3 in no acute distress.  Dermatology: Skin is warm, dry and supple bilateral lower extremities. Negative for open lesions or macerations.  Vascular: Palpable pedal pulses bilaterally. Capillary refill within normal limits.  Edema noted surrounding the sesamoidal apparatus left foot plantar aspect.  No edema or erythema to the dorsal aspect of the first MTP  Neurological: Light touch and protective threshold grossly intact  Musculoskeletal Exam: No pedal deformities noted.  There continues to be tenderness with light touch and palpation along the sesamoid apparatus and fibular sesamoid left.  There is no pain or tenderness to the dorsal aspect of the joint  Radiographic Exam LT foot 09/19/2022: Normal osseous mineralization.  Comminuted type fracture noted to the fibular sesamoid best visualized on oblique view. Radiographic exam LT foot 11/02/2022: Essentially unchanged from prior x-rays with the exception that there is some slight routine healing along the fracture sites of the fibular sesamoid Radiographic exam LT foot 12/28/2022: Possible  osteopenia throughout the fibular sesamoid.  Appearance and fracture fragments mostly unchanged.  Assessment: 1.  Fibular sesamoid fracture left, closed, subsequent encounter with routine healing 2.  Cavus foot type left  -Patient evaluated.  X-rays reviewed again in detail and compared to prior x-rays.   - Postop shoe dispensed.  WBAT.  Discontinue cam boot -Continue meloxicam 15 mg daily -I do believe once the patient transition out of the postsurgical shoe she would benefit from custom molded orthotics to support the medial longitudinal arch of the foot and offload pressure from the first MTP.  Appointment with orthotics department for custom molded insoles 24 weeks  *Going to [place] in May 2024.      Edrick Kins, DPM [facility]  Dr. Edrick Kins, DPM    [address], [place] [address]                Office [phone]  Fax [phone]

## 2023-02-01 ENCOUNTER — Ambulatory Visit (INDEPENDENT_AMBULATORY_CARE_PROVIDER_SITE_OTHER): Payer: No Typology Code available for payment source

## 2023-02-01 ENCOUNTER — Ambulatory Visit: Payer: No Typology Code available for payment source | Admitting: Podiatry

## 2023-02-01 DIAGNOSIS — M25871 Other specified joint disorders, right ankle and foot: Secondary | ICD-10-CM

## 2023-02-01 DIAGNOSIS — S92812A Other fracture of left foot, initial encounter for closed fracture: Secondary | ICD-10-CM

## 2023-02-01 DIAGNOSIS — M25872 Other specified joint disorders, left ankle and foot: Secondary | ICD-10-CM

## 2023-02-01 NOTE — Progress Notes (Signed)
Chief Complaint  Patient presents with   Fracture    Patient came in today for left fracture follow-up, patient has no pain, X-Rays done today     HPI: 32 y.o. female presenting today for follow-up evaluation of fibular sesamoid fracture left foot.  Patient continues WBAT in the cam boot.  She believes there is improvement.  Overall she is doing well.  She continues to have some slight tenderness to the area however  Brief history: Patient states that she woke up morning of 09/18/2022 with pain and tenderness to the forefoot.  Denies a history of injury.  Patient states that she did a significant amount of walking and shopping yesterday throughout the day and noticed increased pain with swelling.  It has been painful and tender ever since.  Initial evaluation was 09/19/2022 in our office.  Past Medical History:  Diagnosis Date   Medical history non-contributory     Past Surgical History:  Procedure Laterality Date   NO PAST SURGERIES      No Known Allergies   Physical Exam: General: The patient is alert and oriented x3 in no acute distress.  Dermatology: Skin is warm, dry and supple bilateral lower extremities. Negative for open lesions or macerations.  Vascular: Palpable pedal pulses bilaterally. Capillary refill within normal limits.  Edema noted surrounding the sesamoidal apparatus left foot plantar aspect.  No edema or erythema to the dorsal aspect of the first MTP  Neurological: Light touch and protective threshold grossly intact  Musculoskeletal Exam: No pedal deformities noted.  Improved tenderness with light touch and palpation along the sesamoid apparatus and fibular sesamoid left.  There is no pain with light range of motion of the first MTP.  There is no pain or tenderness to the dorsal aspect of the joint  Radiographic Exam LT foot 09/19/2022: Normal osseous mineralization.  Comminuted type fracture noted to the fibular sesamoid best visualized on oblique  view. Radiographic exam LT foot 11/02/2022: Essentially unchanged from prior x-rays with the exception that there is some slight routine healing along the fracture sites of the fibular sesamoid Radiographic exam LT foot 12/28/2022: Possible osteopenia throughout the fibular sesamoid.  Appearance and fracture fragments mostly unchanged. Radiographic exam LT foot 02/01/2023: Mostly unchanged.  Assessment: 1.  Fibular sesamoid fracture left, closed, subsequent encounter with routine healing 2.  Cavus foot type left  -Patient evaluated.  X-rays reviewed again in detail and compared to prior x-rays.   - Patient may now transition out of the cam boot into good supportive tennis shoes and sneakers -Continue meloxicam 15 mg daily as needed -Apparently the patient was not molded for last visit for custom orthotics.  I do believe she would benefit from custom orthotics to support the high arches of the feet and offload pressure from the first MTP.  Patient molded today for custom molded insoles -Return to clinic 4 weeks just before she leaves for [place]   *Going to [place] in 03/09/2023      Felecia Shelling, DPM [facility]  Dr. Felecia Shelling, DPM    [address] [place], [place] [address]                Office [phone]  Fax [phone]

## 2023-02-13 ENCOUNTER — Ambulatory Visit: Payer: No Typology Code available for payment source | Admitting: Physician Assistant

## 2023-02-13 ENCOUNTER — Other Ambulatory Visit: Payer: Self-pay | Admitting: Podiatry

## 2023-02-13 VITALS — BP 126/70 | HR 89 | Temp 97.8°F | Ht 64.0 in | Wt 128.2 lb

## 2023-02-13 DIAGNOSIS — M25872 Other specified joint disorders, left ankle and foot: Secondary | ICD-10-CM

## 2023-02-13 DIAGNOSIS — S92812A Other fracture of left foot, initial encounter for closed fracture: Secondary | ICD-10-CM

## 2023-02-13 DIAGNOSIS — H029 Unspecified disorder of eyelid: Secondary | ICD-10-CM

## 2023-02-13 MED ORDER — ERYTHROMYCIN 5 MG/GM OP OINT: TOPICAL_OINTMENT | OPHTHALMIC | 0 refills | Status: DC

## 2023-02-13 NOTE — Progress Notes (Signed)
Kathleen Sanchez is a 32 y.o. female here for a new problem.  History of Present Illness:   Chief Complaint  Patient presents with   Eye Problem    Pt c/o bottom eyelid right eye is red and there is a bump.    Eye Pain  The right eye is affected. This is a new problem. The current episode started yesterday. The problem occurs daily. The problem has been gradually worsening. The pain is moderate. There is No known exposure to pink eye. She Does not wear contacts. Pertinent negatives include no blurred vision, eye discharge, double vision, eye redness, fever, foreign body sensation, itching, nausea, recent URI or vomiting. Treatments tried: warm compress. The treatment provided no relief.      Past Medical History:  Diagnosis Date   Medical history non-contributory      Social History   Tobacco Use   Smoking status: Never   Smokeless tobacco: Never  Vaping Use   Vaping Use: Never used  Substance Use Topics   Alcohol use: Not Currently   Drug use: Never    Past Surgical History:  Procedure Laterality Date   NO PAST SURGERIES      Family History  Problem Relation Age of Onset   Melanoma Mother    Anxiety disorder Maternal Grandmother    Diabetes Paternal Grandmother    Heart attack Paternal Grandmother    Cancer Paternal Grandfather        unknown   Hyperlipidemia Father    Osteoarthritis Maternal Grandfather    Breast cancer Neg Hx     No Known Allergies  Current Medications:   Current Outpatient Medications:    Ferrous Sulfate (IRON PO), Take 1 tablet by mouth daily in the afternoon., Disp: , Rfl:    OVER THE COUNTER MEDICATION, Take 1 capsule by mouth daily in the afternoon. Vit D plus K, Disp: , Rfl:    tretinoin (RETIN-A) 0.025 % cream, Apply topically at bedtime., Disp: , Rfl:    VITAMIN D, CHOLECALCIFEROL, PO, Take 1 capsule by mouth daily in the afternoon., Disp: , Rfl:    Review of Systems:   Review of Systems  Constitutional:  Negative for fever.   Eyes:  Positive for pain. Negative for blurred vision, double vision, discharge, redness and itching.  Gastrointestinal:  Negative for nausea and vomiting.    Vitals:   Vitals:   02/13/23 1058  BP: 126/70  Pulse: 89  Temp: 97.8 F (36.6 C)  TempSrc: Temporal  SpO2: 96%  Weight: 128 lb 4 oz (58.2 kg)  Height: 5\' 4"  (1.626 m)     Body mass index is 22.01 kg/m.  Physical Exam:   Physical Exam Constitutional:      Appearance: Normal appearance. She is well-developed.  HENT:     Head: Normocephalic and atraumatic.  Eyes:     General: Lids are normal.        Right eye: Hordeolum (lower lid) present.     Extraocular Movements: Extraocular movements intact.     Conjunctiva/sclera: Conjunctivae normal.  Pulmonary:     Effort: Pulmonary effort is normal.  Musculoskeletal:        General: Normal range of motion.     Cervical back: Normal range of motion and neck supple.  Skin:    General: Skin is warm and dry.  Neurological:     Mental Status: She is alert and oriented to person, place, and time.  Psychiatric:        Attention and  Perception: Attention and perception normal.        Mood and Affect: Mood normal.        Behavior: Behavior normal.        Thought Content: Thought content normal.        Judgment: Judgment normal.     Assessment and Plan:   Eyelid abnormality Suspect stye No red flags Recommend NSAIDs and warm compresses Safety net rx of erythromycin ointment provided If any worsening sx, she was instructed to reach out asap    Jarold Motto, PA-C

## 2023-03-06 ENCOUNTER — Ambulatory Visit (INDEPENDENT_AMBULATORY_CARE_PROVIDER_SITE_OTHER): Payer: No Typology Code available for payment source

## 2023-03-06 ENCOUNTER — Ambulatory Visit (INDEPENDENT_AMBULATORY_CARE_PROVIDER_SITE_OTHER): Payer: No Typology Code available for payment source | Admitting: Podiatry

## 2023-03-06 DIAGNOSIS — S92812A Other fracture of left foot, initial encounter for closed fracture: Secondary | ICD-10-CM

## 2023-03-06 DIAGNOSIS — Q667 Congenital pes cavus, unspecified foot: Secondary | ICD-10-CM

## 2023-03-06 DIAGNOSIS — M25872 Other specified joint disorders, left ankle and foot: Secondary | ICD-10-CM

## 2023-03-06 NOTE — Progress Notes (Signed)
   Chief Complaint  Patient presents with   Fracture    Patient came in today for left foot sesamoid fracture follow-up     HPI: 32 y.o. female presenting today for follow-up evaluation of fibular sesamoid fracture left foot.  Patient is wearing good supportive tennis shoes and sneakers.  She continues to improve.  She is very satisfied with the progress she has made.  Brief history: Patient states that she woke up morning of 09/18/2022 with pain and tenderness to the forefoot.  Denies a history of injury.  Patient states that she did a significant amount of walking and shopping yesterday throughout the day and noticed increased pain with swelling.  It has been painful and tender ever since.  Initial evaluation was 09/19/2022 in our office.  Past Medical History:  Diagnosis Date   Medical history non-contributory     Past Surgical History:  Procedure Laterality Date   NO PAST SURGERIES      No Known Allergies   Physical Exam: General: The patient is alert and oriented x3 in no acute distress.  Dermatology: Skin is warm, dry and supple bilateral lower extremities. Negative for open lesions or macerations.  Vascular: Palpable pedal pulses bilaterally. Capillary refill within normal limits.  No edema. Neurological: Light touch and protective threshold grossly intact  Musculoskeletal Exam: No pedal deformities noted.  Negative for any appreciable tenderness with light touch and palpation along the sesamoid apparatus and fibular sesamoid left.  There is no pain with light range of motion of the first MTP.  There is no pain or tenderness to the dorsal aspect of the joint  Radiographic Exam LT foot 09/19/2022: Normal osseous mineralization.  Comminuted type fracture noted to the fibular sesamoid best visualized on oblique view. Radiographic exam LT foot 03/06/2023: Mostly unchanged.  Assessment: 1.  Fibular sesamoid fracture left, closed, subsequent encounter with routine healing 2.  Cavus  foot type left  -Patient evaluated.   -Overall the patient states that she is healing nicely and she is very satisfied with the progress.  She no longer has any tenderness when walking.  She is wearing good supportive tennis shoes/hoka -Appointment today for orthotics dispensable -From my standpoint x-rays appear mostly unchanged and stable for few months now.  There is improvement of the foot.  She may slowly increase to full activity no restrictions over the following month -Return to clinic as needed  *Going to [place] in 03/09/2023      Felecia Shelling, DPM [facility]  Dr. Felecia Shelling, DPM    [address] [place], [place] [address]                Office [phone]  Fax [phone]

## 2023-03-06 NOTE — Progress Notes (Signed)
Patient presents today to pick up custom molded foot orthotics recommended by Dr. Logan Bores.   Orthotics were not in. Patient was concerned with impression that was taken with foam box. After viewing the image there were areas that were not visible so new scans were taken.   Patient will be called when orthotics come in.

## 2023-03-17 ENCOUNTER — Other Ambulatory Visit: Payer: Self-pay | Admitting: Podiatry

## 2023-03-17 DIAGNOSIS — S92812A Other fracture of left foot, initial encounter for closed fracture: Secondary | ICD-10-CM

## 2023-04-04 ENCOUNTER — Telehealth: Payer: Self-pay | Admitting: Podiatry

## 2023-04-04 NOTE — Telephone Encounter (Signed)
Lmom for pt to call back to schedule picking up orthotics    Balance 490

## 2023-04-05 NOTE — Telephone Encounter (Signed)
Lvm for pt to schedule for PUO.

## 2023-04-07 ENCOUNTER — Ambulatory Visit (INDEPENDENT_AMBULATORY_CARE_PROVIDER_SITE_OTHER): Payer: No Typology Code available for payment source | Admitting: Podiatry

## 2023-04-07 DIAGNOSIS — Z4689 Encounter for fitting and adjustment of other specified devices: Secondary | ICD-10-CM

## 2023-04-07 NOTE — Progress Notes (Signed)
Patient presents today for fitting and dispensing of custom orthotics.  She is a patient of Dr. Logan Bores.  The patient is currently wearing [employer] brand sneakers.  Today the insoles were removed from her [employer] sneakers and replaced with her custom orthotics.  They were in excellent fit inside her shoes.  Patient did note that the higher arched was noticeable but felt very supportive and comfortable when she was standing and walking.  Discussed the break-in period with the patient.  If she is having any issues with regard to hotspots or improper fit she needs to call the office within the next week or 2 to have any adjustments sent back.  Also discussed refurbishment in the future if the top-cover starts to show signs of wear.  Follow-up with Dr. Logan Bores as needed.

## 2023-09-04 ENCOUNTER — Telehealth: Payer: No Typology Code available for payment source | Admitting: Physician Assistant

## 2023-09-04 DIAGNOSIS — J020 Streptococcal pharyngitis: Secondary | ICD-10-CM | POA: Diagnosis not present

## 2023-09-04 MED ORDER — AMOXICILLIN 500 MG PO CAPS
500.0000 mg | ORAL_CAPSULE | Freq: Two times a day (BID) | ORAL | 0 refills | Status: AC
Start: 2023-09-04 — End: 2023-09-14

## 2023-09-04 NOTE — Progress Notes (Signed)
   Virtual Visit via Video Note  I connected with  Kathleen Sanchez  on 09/04/23 at  1:30 PM EST by a video enabled telemedicine application and verified that I am speaking with the correct person using two identifiers.  Location: Patient: home Provider: Nature conservation officer at [employer] Persons present: Patient and myself   I discussed the limitations of evaluation and management by telemedicine and the availability of in person appointments. The patient expressed understanding and agreed to proceed.   History of Present Illness:  32 yo presenting for VV to discuss possible strep throat. Husband tested positive, and now she's having the same symptoms.  Started with symptoms yesterday - sore throat, fatigue, chills, no fever. No trouble swallowing. No cough. No CP or SOB. No other symptoms. 2 & 34 yo in the household as well, not sure they have symptoms yet but have a call to peds office.      Observations/Objective:   Gen: Awake, alert, no acute distress Resp: Breathing is even and non-labored Psych: calm/pleasant demeanor Neuro: Alert and Oriented x 3, + facial symmetry, speech is clear.   Assessment and Plan:  Strep pharyngitis - Plan: amoxicillin (AMOXIL) 500 MG capsule Will treat for strep pharyngitis.  Patient is to start amoxicillin 500 mg BID x 10 days, use supportive care otherwise. Counseled patient on potential for adverse effects with medications prescribed/recommended today, ER and return-to-clinic precautions discussed, patient verbalized understanding.    Follow Up Instructions:    I discussed the assessment and treatment plan with the patient. The patient was provided an opportunity to ask questions and all were answered. The patient agreed with the plan and demonstrated an understanding of the instructions.   The patient was advised to call back or seek an in-person evaluation if the symptoms worsen or if the condition fails to improve as  anticipated.  Kingsten Enfield M Martin Smeal, PA-C

## 2023-09-28 ENCOUNTER — Encounter: Payer: Self-pay | Admitting: Physician Assistant

## 2023-09-28 ENCOUNTER — Ambulatory Visit: Payer: No Typology Code available for payment source | Admitting: Physician Assistant

## 2023-09-28 VITALS — BP 126/80 | HR 96 | Temp 98.0°F | Ht 64.0 in | Wt 127.0 lb

## 2023-09-28 DIAGNOSIS — J029 Acute pharyngitis, unspecified: Secondary | ICD-10-CM | POA: Diagnosis not present

## 2023-09-28 LAB — POCT RAPID STREP A (OFFICE): Rapid Strep A Screen: NEGATIVE

## 2023-09-28 NOTE — Progress Notes (Signed)
Kathleen Sanchez is a 32 y.o. female here for a new problem.  History of Present Illness:   Chief Complaint  Patient presents with   Sore Throat    Pt c/o sore throat off and on x 2 weeks. Denies fever or chills.   HPI   Sore Throat: Complains of intermittent sore throat for the past 2 weeks.  11/18 seen by Alyssa Allwardt, PA-C and was prescribed 500 mg Amoxicillin BID x 10 days to treat strep, which she did complete.   Today endorses some nasal congestion with associated post-nasal drip and intermittent mild cough, as well as mild left-sided lymph-node swelling.   Notes that about 2 weeks ago they got a new oven which lets off a chemical smell when on. Last night they baked food in it, and she noticed her sore throat began occurring again, though she isn't sure if that is contributing.  States that she hasn't used anything to relieve her sore throat, or other symptoms.  Denies fever/chills, ear pain, or hx of heartburn.     Past Medical History:  Diagnosis Date   Medical history non-contributory     Social History   Tobacco Use   Smoking status: Never   Smokeless tobacco: Never  Vaping Use   Vaping status: Never Used  Substance Use Topics   Alcohol use: Not Currently   Drug use: Never   Past Surgical History:  Procedure Laterality Date   NO PAST SURGERIES     Family History  Problem Relation Age of Onset   Melanoma Mother    Anxiety disorder Maternal Grandmother    Diabetes Paternal Grandmother    Heart attack Paternal Grandmother    Cancer Paternal Grandfather        unknown   Hyperlipidemia Father    Osteoarthritis Maternal Grandfather    Breast cancer Neg Hx    No Known Allergies Current Medications:   Current Outpatient Medications:    Ferrous Sulfate (IRON) 325 (65 Fe) MG TABS, Take 1 tablet by mouth once a week., Disp: , Rfl:    tretinoin (RETIN-A) 0.025 % cream, Apply topically at bedtime., Disp: , Rfl:    VITAMIN D, CHOLECALCIFEROL, PO, Take 1  capsule by mouth once a week., Disp: , Rfl:   Review of Systems:   ROS See pertinent positives and negatives as per the HPI.  Vitals:   Vitals:   09/28/23 1021  BP: 126/80  Pulse: 96  Temp: 98 F (36.7 C)  TempSrc: Temporal  SpO2: 99%  Weight: 127 lb (57.6 kg)  Height: 5\' 4"  (1.626 m)     Body mass index is 21.8 kg/m.  Physical Exam:   Physical Exam Vitals and nursing note reviewed.  Constitutional:      General: She is not in acute distress.    Appearance: She is well-developed. She is not ill-appearing or toxic-appearing.  HENT:     Head: Normocephalic and atraumatic.     Right Ear: Tympanic membrane, ear canal and external ear normal. Tympanic membrane is not erythematous, retracted or bulging.     Left Ear: Tympanic membrane, ear canal and external ear normal. Tympanic membrane is not erythematous, retracted or bulging.     Nose: Nose normal.     Right Sinus: No maxillary sinus tenderness or frontal sinus tenderness.     Left Sinus: No maxillary sinus tenderness or frontal sinus tenderness.     Mouth/Throat:     Pharynx: Uvula midline. Posterior oropharyngeal erythema (very mild) present.  Eyes:     General: Lids are normal.     Conjunctiva/sclera: Conjunctivae normal.  Neck:     Trachea: Trachea normal.  Cardiovascular:     Rate and Rhythm: Normal rate and regular rhythm.     Heart sounds: Normal heart sounds, S1 normal and S2 normal.  Pulmonary:     Effort: Pulmonary effort is normal.     Breath sounds: Normal breath sounds. No decreased breath sounds, wheezing, rhonchi or rales.  Lymphadenopathy:     Cervical: No cervical adenopathy.  Skin:    General: Skin is warm and dry.  Neurological:     Mental Status: She is alert.  Psychiatric:        Speech: Speech normal.        Behavior: Behavior normal. Behavior is cooperative.    Results for orders placed or performed in visit on 09/28/23  POCT rapid strep A  Result Value Ref Range   Rapid Strep A  Screen Negative Negative     Assessment and Plan:   Sore throat No red flags on exam.   No evidence of infection on my exam, do not feel antibiotic(s) are warranted. Recommend starting short-term daily antihistamine to clear up any post-nasal drip. Discussed taking medications as prescribed.  Reviewed return precautions including new or worsening fever, SOB, new or worsening cough or other concerns.  Push fluids and rest.  I recommend that patient follow-up if symptoms worsen or persist despite treatment x 7-10 days, sooner if needed.  I,Emily Lagle,acting as a Neurosurgeon for [employer], PA.,have documented all relevant documentation on the behalf of Jarold Motto, PA,as directed by  Jarold Motto, PA while in the presence of Jarold Motto, [place].  I, Jarold Motto, [place], have reviewed all documentation for this visit. The documentation on 09/28/23 for the exam, diagnosis, procedures, and orders are all accurate and complete.  Jarold Motto, PA-C

## 2023-10-05 ENCOUNTER — Ambulatory Visit: Payer: No Typology Code available for payment source | Admitting: Podiatry

## 2024-01-23 ENCOUNTER — Ambulatory Visit: Admitting: Podiatry

## 2024-01-23 ENCOUNTER — Encounter: Payer: Self-pay | Admitting: Podiatry

## 2024-01-23 DIAGNOSIS — L603 Nail dystrophy: Secondary | ICD-10-CM

## 2024-01-23 NOTE — Progress Notes (Signed)
 She presents today concerned about the tibial border of her left hallux being discolored.  She states that is even red [place] she points to the proximal nail fold she tried Lotrimin and she said it got some better but she stopped it and it just got worse again.  Objective: Vital signs are stable alert oriented x 3 pulses are palpable.  Hallux does demonstrate a discoloration along the medial border from distal to proximal nail fold.  There is mild erythema at the proximal nail fold.  Assessment: Nail dystrophy.  Plan: Discussed etiology pathology conservative surgical therapies samples of the skin and nail were taken today for pathologic evaluation.  Follow-up with her in 30 days

## 2024-02-05 ENCOUNTER — Ambulatory Visit: Admitting: Physician Assistant

## 2024-02-08 ENCOUNTER — Telehealth: Payer: Self-pay | Admitting: Podiatry

## 2024-02-08 NOTE — Telephone Encounter (Signed)
 Checking to if results are back from appt on 4/8. If so, she will like to change her appt to come in sooner. If not, can she be notified when they have been returned so that she can change her appt. TY

## 2024-02-08 NOTE — Telephone Encounter (Signed)
 Notified Pt, gave her the message(s) and was able to resch from 5/8 to 5/1.

## 2024-02-09 ENCOUNTER — Other Ambulatory Visit: Payer: Self-pay | Admitting: Podiatry

## 2024-02-09 ENCOUNTER — Telehealth: Payer: Self-pay | Admitting: Podiatry

## 2024-02-09 NOTE — Telephone Encounter (Signed)
 Patient stated she would like to receive results regarding left foot great toe over the telephone if possible. Contact telephone number, [phone]

## 2024-02-12 ENCOUNTER — Ambulatory Visit: Admitting: Physician Assistant

## 2024-02-15 ENCOUNTER — Encounter: Payer: Self-pay | Admitting: Podiatry

## 2024-02-15 ENCOUNTER — Ambulatory Visit: Admitting: Podiatry

## 2024-02-15 DIAGNOSIS — S90851A Superficial foreign body, right foot, initial encounter: Secondary | ICD-10-CM | POA: Diagnosis not present

## 2024-02-15 DIAGNOSIS — L603 Nail dystrophy: Secondary | ICD-10-CM | POA: Diagnosis not present

## 2024-02-15 NOTE — Progress Notes (Signed)
 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  She also feels like she may have stepped on a small thorn or distal which may still be in her hallux right foot.  She goes on to say that she and her husband are trying for their third child at this point as she does not want to take any oral medication.  Objective: We did discuss the pathology results today which was positive for nail dystrophy as well as Trichophyton rubrum.  She does have a very small punctated area in the tuft of the hallux right.  There is no cellulitic process.  Upon debridement today I was able to remove a small piece of what appeared to be cactus or some type of thorn very small less than a millimeter.  Assessment: Removed a small spicule particulate from her hallux.  Onychomycosis and nail dystrophy.  Plan: Discussed her wanting to get pregnant so we are not going to put her on any medicine at this time she thinks that laser is too expensive and does not want to do any topical.  I will follow-up with her on an as-needed basis.

## 2024-02-22 ENCOUNTER — Ambulatory Visit: Admitting: Podiatry

## 2024-05-24 NOTE — H&P (Signed)
 Kathleen Sanchez is an 33 y.o. female. Presenting for surgical mangement of MAB. 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 Came in for routine OB visit and absent FHT noted with baby measuring 9.2 wga (pt 10.2 wga by dates and prior reassuring US ).    Past Medical History:  Diagnosis Date   Medical history non-contributory     Past Surgical History:  Procedure Laterality Date   NO PAST SURGERIES      Family History  Problem Relation Age of Onset   Melanoma Mother    Anxiety disorder Maternal Grandmother    Diabetes Paternal Grandmother    Heart attack Paternal Grandmother    Cancer Paternal Grandfather        unknown   Hyperlipidemia Father    Osteoarthritis Maternal Grandfather    Breast cancer Neg Hx     Social History:  reports that she has never smoked. She has never used smokeless tobacco. She reports that she does not currently use alcohol. She reports that she does not use drugs.  Allergies: No Known Allergies  No medications prior to admission.    Review of Systems  There were no vitals taken for this visit. Physical Exam Gen: well appearing, NAD CV: Reg rate Pulm: NWOB Abd: soft, nondistended, nontender, no mases GYN: uterus 9 week size, no adnexa ttp/CMT Ext: No edema b/l   No results found for this or any previous visit (from the past 24 hours).  No results found.  Assessment/Plan: Pt counseled on TVUS results and diagnosis of MAB. Discussed that it is unlikely to be her fault nor could she have prevented it. Reviewed that this miscarriage does not likely reflect her ability to have a successful pregnancy in the future, and that miscarriage is common - 1:5 pregnancies. She was counseled on options for managing missed ab including expectant, medical, and surgical management.  Patient desires definitive surgical management with suction dilation and curettage.   Risks/benefits/ and alternatives reviewed with patient with risks including but not limited to bleeding,  infection, uterine perforation, and damage to nearby structures such as the bowel, bladder, vessels, and/or other organs. She was given opportunity to ask questions and all questions answered. Patient consents to proceed with suction dilation and curettage. Reviewed bleeding precautions. Her blood type is RH positive and she does not need Rhogam. Given instructions and she verbalizes understanding. Discussed the importance of having at least one normal periods after completion of the process, before attempting conception again. Discussed S/S to call back for. All questions answered and pt verbalizes understanding w/out further questions/concerns. Risks discussed including infection, bleeding, damage to surrounding structures, need for additional procedures, postoperative DVT and subsequent scarring. All questions answered. Consent signed in office.  Plan on doxycycline postop and consider cytotec  x 3 days.    Kathleen Sanchez 05/24/2024, 8:41 AM

## 2024-05-27 ENCOUNTER — Encounter (HOSPITAL_COMMUNITY): Payer: Self-pay | Admitting: Obstetrics and Gynecology

## 2024-05-27 NOTE — Progress Notes (Signed)
 Spoke w/ via phone for pre-op interview--- pt Lab needs dos----   cbc/ t&s      Lab results------ no COVID test -----patient states asymptomatic no test needed Arrive at ------- 0700 on 05-31-2024 NPO after MN w/ exception sips of water w/ meds Pre-Surgery Ensure or G2: n/a  Med rec completed Medications to take morning of surgery ----- none Diabetic medication ----- n/a  GLP1 agonist last dose: n/a GLP1 instructions:  Patient instructed no nail polish to be worn day of surgery Patient instructed to bring photo id and insurance card day of surgery Patient aware to have Driver (ride ) / caregiver    for 24 hours after surgery - mother , Kathleen Sanchez then husband will be here after dropping off kids, Kathleen Sanchez Patient Special Instructions ----- antibacterial soap shower morning of surgery Pre-Op special Instructions ----- n/a  Patient verbalized understanding of instructions that were given at this phone interview. Patient denies chest pain, sob, fever, cough at the interview.

## 2024-05-31 ENCOUNTER — Ambulatory Visit (HOSPITAL_COMMUNITY)
Admission: RE | Admit: 2024-05-31 | Discharge: 2024-05-31 | Disposition: A | Discharge status: Home / Self Care | Attending: Obstetrics and Gynecology | Admitting: Obstetrics and Gynecology

## 2024-05-31 ENCOUNTER — Ambulatory Visit (HOSPITAL_COMMUNITY): Admitting: Anesthesiology

## 2024-05-31 ENCOUNTER — Other Ambulatory Visit: Payer: Self-pay

## 2024-05-31 ENCOUNTER — Encounter (HOSPITAL_COMMUNITY)
Admission: RE | Disposition: A | Discharge status: Home / Self Care | Payer: Self-pay | Source: Home / Self Care | Attending: Obstetrics and Gynecology

## 2024-05-31 ENCOUNTER — Encounter (HOSPITAL_COMMUNITY): Payer: Self-pay | Admitting: Obstetrics and Gynecology

## 2024-05-31 DIAGNOSIS — Z01818 Encounter for other preprocedural examination: Secondary | ICD-10-CM

## 2024-05-31 DIAGNOSIS — Z3A09 9 weeks gestation of pregnancy: Secondary | ICD-10-CM

## 2024-05-31 DIAGNOSIS — O021 Missed abortion: Secondary | ICD-10-CM | POA: Diagnosis present

## 2024-05-31 DIAGNOSIS — O039 Complete or unspecified spontaneous abortion without complication: Secondary | ICD-10-CM

## 2024-05-31 HISTORY — DX: Missed abortion: O02.1

## 2024-05-31 HISTORY — PX: DILATION AND EVACUATION: SHX1459

## 2024-05-31 LAB — CBC
HCT: 39.9 % (ref 36.0–46.0)
Hemoglobin: 13.9 g/dL (ref 12.0–15.0)
MCH: 30.2 pg (ref 26.0–34.0)
MCHC: 34.8 g/dL (ref 30.0–36.0)
MCV: 86.6 fL (ref 80.0–100.0)
Platelets: 172 K/uL (ref 150–400)
RBC: 4.61 MIL/uL (ref 3.87–5.11)
RDW: 12.9 % (ref 11.5–15.5)
WBC: 5.1 K/uL (ref 4.0–10.5)
nRBC: 0 % (ref 0.0–0.2)

## 2024-05-31 LAB — TYPE AND SCREEN
ABO/RH(D): O POS
Antibody Screen: NEGATIVE

## 2024-05-31 SURGERY — DILATION AND EVACUATION, UTERUS
Anesthesia: Monitor Anesthesia Care | Site: Uterus

## 2024-05-31 MED ORDER — METHYLERGONOVINE MALEATE 0.2 MG/ML IJ SOLN
INTRAMUSCULAR | Status: AC
  Filled 2024-05-31: qty 1

## 2024-05-31 MED ORDER — MIDAZOLAM HCL 2 MG/2ML IJ SOLN
INTRAMUSCULAR | Status: AC
  Filled 2024-05-31: qty 2

## 2024-05-31 MED ORDER — TRANEXAMIC ACID-NACL 1000-0.7 MG/100ML-% IV SOLN
1000.0000 mg | Freq: Once | INTRAVENOUS | Status: AC
  Administered 2024-05-31: 1000 mg via INTRAVENOUS
  Filled 2024-05-31: qty 100

## 2024-05-31 MED ORDER — POVIDONE-IODINE 10 % EX SWAB: 2.0000 | Freq: Once | CUTANEOUS | Status: DC

## 2024-05-31 MED ORDER — PROPOFOL 10 MG/ML IV BOLUS
INTRAVENOUS | Status: AC
  Filled 2024-05-31: qty 20

## 2024-05-31 MED ORDER — DEXAMETHASONE SODIUM PHOSPHATE 10 MG/ML IJ SOLN
INTRAMUSCULAR | Status: DC | PRN
Start: 2024-05-31 — End: 2024-05-31
  Administered 2024-05-31: 5 mg via INTRAVENOUS

## 2024-05-31 MED ORDER — CHLORHEXIDINE GLUCONATE 0.12 % MT SOLN
OROMUCOSAL | Status: AC
  Filled 2024-05-31: qty 15

## 2024-05-31 MED ORDER — PROPOFOL 500 MG/50ML IV EMUL
INTRAVENOUS | Status: DC | PRN
  Administered 2024-05-31: 200 ug/kg/min via INTRAVENOUS

## 2024-05-31 MED ORDER — CELECOXIB 200 MG PO CAPS
200.0000 mg | ORAL_CAPSULE | Freq: Once | ORAL | Status: AC
  Administered 2024-05-31: 200 mg via ORAL

## 2024-05-31 MED ORDER — CHLOROPROCAINE HCL 1 % IJ SOLN
INTRAMUSCULAR | Status: DC | PRN
  Administered 2024-05-31: 10 mL

## 2024-05-31 MED ORDER — LACTATED RINGERS IV SOLN: INTRAVENOUS | Status: DC

## 2024-05-31 MED ORDER — OXYCODONE HCL 5 MG/5ML PO SOLN: 5.0000 mg | Freq: Once | ORAL | Status: DC | PRN

## 2024-05-31 MED ORDER — CHLOROPROCAINE HCL 1 % IJ SOLN
INTRAMUSCULAR | Status: AC
  Filled 2024-05-31: qty 30

## 2024-05-31 MED ORDER — FENTANYL CITRATE (PF) 100 MCG/2ML IJ SOLN
INTRAMUSCULAR | Status: AC
  Filled 2024-05-31: qty 2

## 2024-05-31 MED ORDER — OXYCODONE HCL 5 MG PO TABS: 5.0000 mg | ORAL_TABLET | Freq: Once | ORAL | Status: DC | PRN

## 2024-05-31 MED ORDER — CHLORHEXIDINE GLUCONATE 0.12 % MT SOLN
15.0000 mL | Freq: Once | OROMUCOSAL | Status: AC
  Administered 2024-05-31: 15 mL via OROMUCOSAL

## 2024-05-31 MED ORDER — ACETAMINOPHEN 500 MG PO TABS
ORAL_TABLET | ORAL | Status: AC
  Filled 2024-05-31: qty 2

## 2024-05-31 MED ORDER — MIDAZOLAM HCL 5 MG/5ML IJ SOLN
INTRAMUSCULAR | Status: DC | PRN
  Administered 2024-05-31: 2 mg via INTRAVENOUS

## 2024-05-31 MED ORDER — DROPERIDOL 2.5 MG/ML IJ SOLN
0.6250 mg | Freq: Once | INTRAMUSCULAR | Status: DC | PRN
Start: 2024-05-31 — End: 2024-05-31

## 2024-05-31 MED ORDER — FENTANYL CITRATE (PF) 100 MCG/2ML IJ SOLN: 25.0000 ug | INTRAMUSCULAR | Status: DC | PRN

## 2024-05-31 MED ORDER — ACETAMINOPHEN 500 MG PO TABS
1000.0000 mg | ORAL_TABLET | Freq: Once | ORAL | Status: AC
  Administered 2024-05-31: 1000 mg via ORAL

## 2024-05-31 MED ORDER — TRANEXAMIC ACID-NACL 1000-0.7 MG/100ML-% IV SOLN
INTRAVENOUS | Status: AC
  Filled 2024-05-31: qty 100

## 2024-05-31 MED ORDER — DEXAMETHASONE SODIUM PHOSPHATE 10 MG/ML IJ SOLN
INTRAMUSCULAR | Status: AC
  Filled 2024-05-31: qty 1

## 2024-05-31 MED ORDER — CELECOXIB 200 MG PO CAPS
ORAL_CAPSULE | ORAL | Status: AC
  Filled 2024-05-31: qty 1

## 2024-05-31 MED ORDER — MISOPROSTOL 200 MCG PO TABS
ORAL_TABLET | ORAL | Status: AC
  Filled 2024-05-31: qty 5

## 2024-05-31 MED ORDER — ORAL CARE MOUTH RINSE: 15.0000 mL | Freq: Once | OROMUCOSAL | Status: AC

## 2024-05-31 MED ORDER — 0.9 % SODIUM CHLORIDE (POUR BTL) OPTIME
TOPICAL | Status: DC | PRN
  Administered 2024-05-31: 1000 mL

## 2024-05-31 MED ORDER — CARBOPROST TROMETHAMINE 250 MCG/ML IM SOLN
INTRAMUSCULAR | Status: AC
  Filled 2024-05-31: qty 1

## 2024-05-31 MED ORDER — ONDANSETRON HCL 4 MG/2ML IJ SOLN
INTRAMUSCULAR | Status: AC
  Filled 2024-05-31: qty 2

## 2024-05-31 MED ORDER — ONDANSETRON HCL 4 MG/2ML IJ SOLN
INTRAMUSCULAR | Status: DC | PRN
  Administered 2024-05-31: 4 mg via INTRAVENOUS

## 2024-05-31 MED ORDER — FENTANYL CITRATE (PF) 100 MCG/2ML IJ SOLN
INTRAMUSCULAR | Status: DC | PRN
  Administered 2024-05-31: 50 ug via INTRAVENOUS
  Administered 2024-05-31 (×2): 25 ug via INTRAVENOUS

## 2024-05-31 SURGICAL SUPPLY — 22 items
CATH ROBINSON RED A/P 16FR (CATHETERS) ×1 IMPLANT
COVER MAYO STAND STRL (DRAPES) ×1 IMPLANT
DRSG TELFA 3X8 NADH STRL (GAUZE/BANDAGES/DRESSINGS) ×1 IMPLANT
FILTER UTR ASPR ASSEMBLY (MISCELLANEOUS) ×1 IMPLANT
GLOVE BIO SURGEON STRL SZ 6.5 (GLOVE) ×1 IMPLANT
GLOVE BIOGEL PI IND STRL 7.0 (GLOVE) ×2 IMPLANT
GOWN STRL REUS W/ TWL LRG LVL3 (GOWN DISPOSABLE) ×2 IMPLANT
HOSE CONNECTING 18IN BERKELEY (TUBING) ×1 IMPLANT
KIT BERKELEY 1ST TRI 3/8 NO TR (MISCELLANEOUS) ×1 IMPLANT
KIT BERKELEY 1ST TRIMESTER 3/8 (MISCELLANEOUS) ×1 IMPLANT
NS IRRIG 1000ML POUR BTL (IV SOLUTION) ×1 IMPLANT
PACK VAGINAL MINOR WOMEN LF (CUSTOM PROCEDURE TRAY) ×1 IMPLANT
PAD OB MATERNITY 11 LF (PERSONAL CARE ITEMS) ×1 IMPLANT
SET BERKELEY SUCTION TUBING (SUCTIONS) ×1 IMPLANT
SPIKE FLUID TRANSFER (MISCELLANEOUS) ×1 IMPLANT
SYR CONTROL 10ML LL (SYRINGE) ×1 IMPLANT
TOWEL GREEN STERILE FF (TOWEL DISPOSABLE) ×2 IMPLANT
UNDERPAD 30X36 HEAVY ABSORB (UNDERPADS AND DIAPERS) ×1 IMPLANT
VACURETTE 10 RIGID CVD (CANNULA) IMPLANT
VACURETTE 7MM CVD STRL WRAP (CANNULA) IMPLANT
VACURETTE 8 RIGID CVD (CANNULA) IMPLANT
VACURETTE 9 RIGID CVD (CANNULA) IMPLANT

## 2024-05-31 NOTE — Op Note (Signed)
 PREOPERATIVE DIAGNOSES: 1. Missed Abortion in 1st trimester  POSTOPERATIVE DIAGNOSES: Same  PROCEDURE PERFORMED: Dilation, suction, sharp curretage  SURGEON: Dr. Kelly Milian  ANESTHESIA: Paracervical block and IV sedation  ESTIMATED BLOOD LOSS: 50cc.  COMPLICATIONS: None  TUBES: None.  DRAINS: None  PATHOLOGY: Endometrial curettings  FINDINGS: On exam, under anesthesia, normal appearing vulva and vagina, 9 week sized uterus  Operative findings demonstrated plethora of POCs  Procedure: The patient was taken to the operating room where she was properly prepped and draped in sterile manner under general anesthesia. After bimanual examination, the cervix was exposed with a speculum and the anterior lip of the cervix grasped with a tenaculum. Paracervical block performed. The endocervical canal was then progressively dilated to 9mm. Suction catheter was introduced into the uterus and to the uterine fundus. The uterus was evacuated and good tissue return was noted. A sharp curettage was then performed until gritty texture noted. All instruments were removed from vagina. The sponge and lap counts were correct times 2 at this time. The patient's procedure was terminated. We then awakened her. She was sent to the Recovery Room in good condition.    Kelly Milian MD

## 2024-05-31 NOTE — Discharge Instructions (Signed)
     No acetaminophen /Tylenol  until after 1:45 pm today if needed.    No ibuprofen , Advil , Aleve, Motrin , ketorolac , meloxicam , naproxen, or other NSAIDS until after 1:45 pm today if needed.     Post Anesthesia Home Care Instructions  Activity: Get plenty of rest for the remainder of the day. A responsible individual must stay with you for 24 hours following the procedure.  For the next 24 hours, DO NOT: -Drive a car -Advertising copywriter -Drink alcoholic beverages -Take any medication unless instructed by your physician -Make any legal decisions or sign important papers.  Meals: Start with liquid foods such as gelatin or soup. Progress to regular foods as tolerated. Avoid greasy, spicy, heavy foods. If nausea and/or vomiting occur, drink only clear liquids until the nausea and/or vomiting subsides. Call your physician if vomiting continues.  Special Instructions/Symptoms: Your throat may feel dry or sore from the anesthesia or the breathing tube placed in your throat during surgery. If this causes discomfort, gargle with warm salt water. The discomfort should disappear within 24 hours.

## 2024-05-31 NOTE — Anesthesia Preprocedure Evaluation (Addendum)
 Anesthesia Evaluation  Patient identified by MRN, date of birth, ID band Patient awake    Reviewed: Allergy & Precautions, NPO status , Patient's Chart, lab work & pertinent test results  Airway Mallampati: I  TM Distance: >3 FB Neck ROM: Full    Dental no notable dental hx.    Pulmonary neg pulmonary ROS   Pulmonary exam normal        Cardiovascular negative cardio ROS  Rhythm:Regular Rate:Normal     Neuro/Psych negative neurological ROS  negative psych ROS   GI/Hepatic negative GI ROS, Neg liver ROS,,,  Endo/Other  negative endocrine ROS    Renal/GU negative Renal ROS  Female GU complaint Missed ab    Musculoskeletal negative musculoskeletal ROS (+)    Abdominal Normal abdominal exam  (+)   Peds  Hematology negative hematology ROS (+)   Anesthesia Other Findings   Reproductive/Obstetrics                              Anesthesia Physical Anesthesia Plan  ASA: 1  Anesthesia Plan: MAC   Post-op Pain Management: Celebrex  PO (pre-op)* and Tylenol  PO (pre-op)*   Induction: Intravenous  PONV Risk Score and Plan: 2 and Ondansetron , Dexamethasone , Propofol  infusion, Midazolam  and Treatment may vary due to age or medical condition  Airway Management Planned: Simple Face Mask and Nasal Cannula  Additional Equipment: None  Intra-op Plan:   Post-operative Plan:   Informed Consent: I have reviewed the patients History and Physical, chart, labs and discussed the procedure including the risks, benefits and alternatives for the proposed anesthesia with the patient or authorized representative who has indicated his/her understanding and acceptance.     Dental advisory given  Plan Discussed with: CRNA  Anesthesia Plan Comments:          Anesthesia Quick Evaluation

## 2024-05-31 NOTE — Transfer of Care (Signed)
 Immediate Anesthesia Transfer of Care Note  Patient: Kathleen Sanchez  Procedure(s) Performed: DILATION AND EVACUATION, UTERUS (Uterus)  Patient Location: PACU  Anesthesia Type:MAC  Level of Consciousness: sedated  Airway & Oxygen Therapy: Patient Spontanous Breathing  Post-op Assessment: Report given to RN  Post vital signs: Reviewed and stable  Last Vitals:  Vitals Value Taken Time  BP 93/61 05/31/24 09:16  Temp    Pulse 68 05/31/24 09:20  Resp 9 05/31/24 09:20  SpO2 100 % 05/31/24 09:20  Vitals shown include unfiled device data.  Last Pain:  Vitals:   05/31/24 0721  TempSrc: Oral  PainSc: 0-No pain      Patients Stated Pain Goal: 2 (05/31/24 0721)  Complications: No notable events documented.

## 2024-05-31 NOTE — Progress Notes (Signed)
 No updates to above H&P. Patient arrived NPO and was consented in PACU. Risks again discussed, all questions answered, and consent signed. Proceed with above surgery.  Declines genetics.    Kelly Milian MD

## 2024-05-31 NOTE — Anesthesia Postprocedure Evaluation (Signed)
 Anesthesia Post Note  Patient: Kathleen Sanchez  Procedure(s) Performed: DILATION AND EVACUATION, UTERUS (Uterus)     Patient location during evaluation: PACU Anesthesia Type: MAC Level of consciousness: awake and alert Pain management: pain level controlled Vital Signs Assessment: post-procedure vital signs reviewed and stable Respiratory status: spontaneous breathing, nonlabored ventilation, respiratory function stable and patient connected to nasal cannula oxygen Cardiovascular status: stable and blood pressure returned to baseline Postop Assessment: no apparent nausea or vomiting Anesthetic complications: no   No notable events documented.  Last Vitals:  Vitals:   05/31/24 0945 05/31/24 1000  BP: 96/64 106/68  Pulse: 62 71  Resp: 12 11  Temp:    SpO2: 100% 100%    Last Pain:  Vitals:   05/31/24 1000  TempSrc:   PainSc: 0-No pain                 Cordella P Diezel Mazur

## 2024-06-01 ENCOUNTER — Encounter (HOSPITAL_COMMUNITY): Payer: Self-pay | Admitting: Obstetrics and Gynecology

## 2024-06-04 LAB — SURGICAL PATHOLOGY

## 2024-10-08 ENCOUNTER — Encounter: Payer: Self-pay | Admitting: Family Medicine

## 2024-10-08 ENCOUNTER — Ambulatory Visit: Admitting: Family Medicine

## 2024-10-08 VITALS — BP 106/70 | HR 92 | Temp 98.4°F | Ht 64.0 in | Wt 130.0 lb

## 2024-10-08 DIAGNOSIS — H6501 Acute serous otitis media, right ear: Secondary | ICD-10-CM

## 2024-10-08 MED ORDER — FLUTICASONE PROPIONATE 50 MCG/ACT NA SUSP: 2.0000 | Freq: Every day | NASAL | 0 refills | Status: AC

## 2024-10-08 MED ORDER — PREDNISONE 10 MG PO TABS: ORAL_TABLET | ORAL | 0 refills | Status: AC

## 2024-10-08 NOTE — Progress Notes (Signed)
 "  Acute Office Visit   Subjective:  Patient ID: Kathleen Sanchez, female    DOB: 1991-08-26, 33 y.o.   MRN: [id number]  Chief Complaint  Patient presents with   Ear Pain    HPI Patient is here for an acute visit. Her primary care provider is Lucie Buttner, [place].   Patient reports when she swallows she will having popping in both ears, but pressure is in the right side. Denies fever, pain, or drainage.   She reports she was seen at [facility] for sore throat and fluid behind the TM. She reports she took Prednisone  40mg  daily for 5 days and used Afrin for 3 days. Didn't try Flonase  nasal spray. She didn't have any relief.  ROS See HPI above      Objective:   BP 106/70   Pulse 92   Temp 98.4 F (36.9 C) (Oral)   Ht 5' 4 (1.626 m)   Wt 130 lb (59 kg)   SpO2 99%   BMI 22.31 kg/m    Physical Exam Vitals reviewed.  Constitutional:      General: She is not in acute distress.    Appearance: Normal appearance. She is not ill-appearing, toxic-appearing or diaphoretic.  HENT:     Head: Normocephalic and atraumatic.     Right Ear: Ear canal and external ear normal. There is no impacted cerumen.     Left Ear: Tympanic membrane, ear canal and external ear normal. There is no impacted cerumen.     Ears:     Comments: Clear fluid behind TM.  Eyes:     General:        Right eye: No discharge.        Left eye: No discharge.     Conjunctiva/sclera: Conjunctivae normal.  Cardiovascular:     Rate and Rhythm: Normal rate.  Pulmonary:     Effort: Pulmonary effort is normal. No respiratory distress.  Musculoskeletal:        General: Normal range of motion.  Skin:    General: Skin is warm and dry.  Neurological:     General: No focal deficit present.     Mental Status: She is alert and oriented to person, place, and time. Mental status is at baseline.  Psychiatric:        Mood and Affect: Mood normal.        Behavior: Behavior normal.         Thought Content: Thought content normal.        Judgment: Judgment normal.       Assessment & Plan:  Non-recurrent acute serous otitis media of right ear -     Fluticasone  Propionate; Place 2 sprays into both nostrils daily.  Dispense: 16 g; Refill: 0 -     predniSONE ; Take 6 tablets (60 mg total) by mouth daily with breakfast for 1 day, THEN 5 tablets (50 mg total) daily with breakfast for 1 day, THEN 4 tablets (40 mg total) daily with breakfast for 1 day, THEN 3 tablets (30 mg total) daily with breakfast for 1 day, THEN 2 tablets (20 mg total) daily with breakfast for 1 day, THEN 1 tablet (10 mg total) daily with breakfast for 1 day.  Dispense: 21 tablet; Refill: 0  -Recommend to try Flonase  spray, place 2 sprays into both nostrils daily to help with fluid in the your right ear. -If not improved by Friday, recommend to try another round of Prednisone  10mg , 6  day taper.  -If not improved with Flonase  and Prednisone , follow up with primary care provider.   Prentis Langdon, NP "

## 2024-10-08 NOTE — Patient Instructions (Signed)
-  It was a pleasure to care for you. -Recommend to try Flonase  spray, place 2 sprays into both nostrils daily to help with fluid in the your right ear. -If not improved by Friday, recommend to try another round of Prednisone  10mg , 6 day taper.  -If not improved with Flonase  and Prednisone , follow up with primary care provider.
